# Patient Record
Sex: Male | Born: 1970 | Race: White | Hispanic: No | Marital: Single | Smoking: Former smoker
Health system: Southern US, Community
[De-identification: ages and names within clinical notes are randomized; demographics above are authoritative.]

## PROBLEM LIST (undated history)

## (undated) DIAGNOSIS — Q100 Congenital ptosis: Secondary | ICD-10-CM

## (undated) DIAGNOSIS — N200 Calculus of kidney: Secondary | ICD-10-CM

## (undated) DIAGNOSIS — M21752 Unequal limb length (acquired), left femur: Secondary | ICD-10-CM

## (undated) DIAGNOSIS — B1920 Unspecified viral hepatitis C without hepatic coma: Secondary | ICD-10-CM

## (undated) DIAGNOSIS — H524 Presbyopia: Secondary | ICD-10-CM

## (undated) DIAGNOSIS — H53031 Strabismic amblyopia, right eye: Secondary | ICD-10-CM

## (undated) DIAGNOSIS — M129 Arthropathy, unspecified: Secondary | ICD-10-CM

## (undated) DIAGNOSIS — K7400 Hepatic fibrosis, unspecified: Secondary | ICD-10-CM

## (undated) HISTORY — PX: EYE SURGERY: SHX253

## (undated) HISTORY — PX: ESOPHAGOGASTRODUODENOSCOPY: SHX1529

## (undated) HISTORY — PX: LEG SURGERY: SHX1003

## (undated) HISTORY — PX: OTHER SURGICAL HISTORY: SHX169

---

## 2018-12-28 ENCOUNTER — Emergency Department (HOSPITAL_COMMUNITY)
Admission: EM | Admit: 2018-12-28 | Discharge: 2018-12-29 | Attending: Emergency Medicine | Admitting: Emergency Medicine

## 2018-12-28 ENCOUNTER — Encounter (HOSPITAL_COMMUNITY): Payer: Self-pay

## 2018-12-28 ENCOUNTER — Other Ambulatory Visit: Payer: Self-pay

## 2018-12-28 ENCOUNTER — Emergency Department (HOSPITAL_COMMUNITY)

## 2018-12-28 DIAGNOSIS — N201 Calculus of ureter: Secondary | ICD-10-CM | POA: Diagnosis not present

## 2018-12-28 DIAGNOSIS — Z87891 Personal history of nicotine dependence: Secondary | ICD-10-CM | POA: Diagnosis not present

## 2018-12-28 DIAGNOSIS — R1030 Lower abdominal pain, unspecified: Secondary | ICD-10-CM | POA: Diagnosis present

## 2018-12-28 DIAGNOSIS — Z79899 Other long term (current) drug therapy: Secondary | ICD-10-CM | POA: Diagnosis not present

## 2018-12-28 HISTORY — DX: Strabismic amblyopia, right eye: H53.031

## 2018-12-28 HISTORY — DX: Unspecified viral hepatitis C without hepatic coma: B19.20

## 2018-12-28 HISTORY — DX: Congenital ptosis: Q10.0

## 2018-12-28 HISTORY — DX: Presbyopia: H52.4

## 2018-12-28 HISTORY — DX: Hepatic fibrosis, unspecified: K74.00

## 2018-12-28 HISTORY — DX: Arthropathy, unspecified: M12.9

## 2018-12-28 HISTORY — DX: Unequal limb length (acquired), left femur: M21.752

## 2018-12-28 LAB — URINALYSIS, ROUTINE W REFLEX MICROSCOPIC
Bilirubin Urine: NEGATIVE
Glucose, UA: NEGATIVE mg/dL
Ketones, ur: NEGATIVE mg/dL
Leukocytes,Ua: NEGATIVE
Nitrite: NEGATIVE
Protein, ur: 30 mg/dL — AB
RBC / HPF: >50 RBC/hpf — ABNORMAL HIGH (ref 0–5)
Specific Gravity, Urine: 1.013 (ref 1.005–1.030)
pH: 5 (ref 5.0–8.0)

## 2018-12-28 MED ORDER — ONDANSETRON 4 MG PO TBDP: 4.0000 mg | ORAL_TABLET | Freq: Three times a day (TID) | ORAL | 0 refills | Status: DC | PRN

## 2018-12-28 MED ORDER — TAMSULOSIN HCL 0.4 MG PO CAPS: 0.4000 mg | ORAL_CAPSULE | Freq: Every day | ORAL | 0 refills | Status: DC

## 2018-12-28 MED ORDER — HYDROCODONE-ACETAMINOPHEN 5-325 MG PO TABS: 2.0000 | ORAL_TABLET | ORAL | 0 refills | Status: DC | PRN

## 2018-12-28 NOTE — Discharge Instructions (Addendum)
Follow up with Urology for evaluation  °

## 2018-12-28 NOTE — ED Triage Notes (Signed)
Pt currently inmate at [facility]. Pt states he had a kidney stone last week. Pt states he started having pain in groin area this evening as well as difficulty urinating. Pt states he is still having difficulty urinating. Pt denies fever.

## 2018-12-28 NOTE — ED Provider Notes (Addendum)
[facility] EMERGENCY DEPARTMENT Provider Note   CSN: [id number] Arrival date & time: 12/28/18  2056     History   Chief Complaint Chief Complaint  Patient presents with  . Groin Pain    HPI Darius Jones is a 48 y.o. male.     The history is provided by the patient. No language interpreter was used.  Groin Pain This is a new problem. The current episode started 2 days ago. The problem occurs constantly. The problem has been gradually worsening. Pertinent negatives include no abdominal pain. Nothing aggravates the symptoms. He has tried nothing for the symptoms. The treatment provided no relief.   Pt reports he thinks he is passing a kidney stone.  Pt reports he had pain in his back that radiated to his groin.  Pt reports he had severe pain 2 days ago that resolved.  Pain returned today.   Past Medical History:  Diagnosis Date  . Arthropathy   . Congenital ptosis   . Hepatic fibrosis   . Hepatitis C   . Presbyopia   . Strabismic amblyopia of right eye   . Unequal limb length (acquired), left femur     There are no active problems to display for this patient.   Past Surgical History:  Procedure Laterality Date  . ESOPHAGOGASTRODUODENOSCOPY    . EYE SURGERY    . LEG SURGERY          Home Medications    Prior to Admission medications   Medication Sig Start Date End Date Taking? Authorizing Provider  acetaminophen (TYLENOL) 650 MG CR tablet Take 650 mg by mouth every 8 (eight) hours as needed for pain.   Yes [provider]  amoxicillin-clavulanate (AUGMENTIN) 875-125 MG tablet Take 1 tablet by mouth 2 (two) times daily. 10 day course starting on 12/19/2018   Yes [provider]  ibuprofen (ADVIL) 800 MG tablet Take 800 mg by mouth every 8 (eight) hours as needed for mild pain or moderate pain.    Yes [provider]    Family History No family history on file.  Social History Social History   Tobacco Use  . Smoking status: Former  Games developer  . Smokeless tobacco: Never Used  Substance Use Topics  . Alcohol use: Not Currently  . Drug use: Not Currently     Allergies   Patient has no known allergies.   Review of Systems Review of Systems  Gastrointestinal: Negative for abdominal pain.  All other systems reviewed and are negative.    Physical Exam Updated Vital Signs BP 139/81   Pulse 65   Temp 98.6 F (37 C) (Oral)   Resp 16   Ht 5\' 10"  (1.778 m)   Wt 115.7 kg   SpO2 96%   BMI 36.59 kg/m   Physical Exam Vitals signs and nursing note reviewed.  Constitutional:      Appearance: He is well-developed.  HENT:     Head: Normocephalic.     Nose: Nose normal.     Mouth/Throat:     Mouth: Mucous membranes are moist.  Neck:     Musculoskeletal: Normal range of motion.  Cardiovascular:     Rate and Rhythm: Normal rate.  Pulmonary:     Effort: Pulmonary effort is normal.  Abdominal:     General: There is no distension.  Musculoskeletal: Normal range of motion.  Skin:    General: Skin is warm.  Neurological:     Mental Status: He is alert and oriented  to person, place, and time.  Psychiatric:        Mood and Affect: Mood normal.      ED Treatments / Results  Labs (all labs ordered are listed, but only abnormal results are displayed) Labs Reviewed  URINALYSIS, ROUTINE W REFLEX MICROSCOPIC - Abnormal; Notable for the following components:      Result Value   APPearance HAZY (*)    Hgb urine dipstick LARGE (*)    Protein, ur 30 (*)    RBC / HPF >50 (*)    Bacteria, UA RARE (*)    All other components within normal limits    EKG None  Radiology Ct Renal Stone Study  Result Date: 12/28/2018 CLINICAL DATA:  Initial evaluation for acute left flank pain. EXAM: CT ABDOMEN AND PELVIS WITHOUT CONTRAST TECHNIQUE: Multidetector CT imaging of the abdomen and pelvis was performed following the standard protocol without IV contrast. COMPARISON:  Prior CT from 07/16/2009. FINDINGS: Lower chest: Mild  subsegmental atelectatic changes noted dependently within the visualized lung bases. Visualized lungs are otherwise clear. Hepatobiliary: Liver demonstrates a normal unenhanced appearance. Gallbladder within normal limits. No biliary dilatation. Pancreas: Pancreas within normal limits. Spleen: Spleen within normal limits. Adrenals/Urinary Tract: Adrenal glands are normal. There is a 3 mm obstructive stone within the distal left ureter with secondary mild left hydroureteronephrosis. No other radiopaque calculi seen along the course of the left ureter. Additional punctate nonobstructive stone noted within the interpolar left kidney. Right kidney within normal limits without nephrolithiasis or obstructive uropathy. Subcentimeter hypodensity at the upper pole the right kidney not well assessed on this noncontrast examination, but could reflect a small cyst. No obstructive radiopaque calculi seen along the course of the right renal collecting system. No right-sided hydroureter. Partially distended bladder within normal limits. No layering stones within the bladder lumen. Stomach/Bowel: Small hiatal hernia noted. Stomach otherwise unremarkable. No evidence for bowel obstruction. Normal appendix. No acute inflammatory changes seen about the bowels. Vascular/Lymphatic: Mild aorto bi-iliac atherosclerotic disease. No aneurysm. No adenopathy. Reproductive: Prostate within normal limits. Other: No free air or fluid. Small fat containing paraumbilical hernia noted without associated inflammation. Musculoskeletal: No acute osseous abnormality. No discrete lytic or blastic osseous lesions. Osteoarthritic changes about the hips bilaterally. Bone harvest site noted at the posterior left iliac wing. IMPRESSION: 1. 3 mm obstructive stone within the distal left ureter with secondary mild left hydroureteronephrosis. 2. Additional punctate nonobstructive left renal calculus. 3. No other acute intra-abdominal or pelvic process.  Electronically Signed   By: Rise MuBenjamin  McClintock M.D.   On: 12/28/2018 23:34    Procedures Procedures (including critical care time)  Medications Ordered in ED Medications - No data to display   Initial Impression / Assessment and Plan / ED Course  I have reviewed the triage vital signs and the nursing notes.  Pertinent labs & imaging results that were available during my care of the patient were reviewed by me and considered in my medical decision making (see chart for details).        MDM  Pt counseled on results.  Pt advised to follow up with urology.   Meds ordered this encounter  Medications  . tamsulosin (FLOMAX) 0.4 MG CAPS capsule    Sig: Take 1 capsule (0.4 mg total) by mouth daily.    Dispense:  14 capsule    Refill:  0    Order Specific Question:   Supervising Provider    Answer:   MILLER, BRIAN [[id number]]  . HYDROcodone-acetaminophen (NORCO/VICODIN)  5-325 MG tablet    Sig: Take 2 tablets by mouth every 4 (four) hours as needed.    Dispense:  10 tablet    Refill:  0    Order Specific Question:   Supervising Provider    Answer:   Sabra Heck, BRIAN [[id number]]  . ondansetron (ZOFRAN ODT) 4 MG disintegrating tablet    Sig: Take 1 tablet (4 mg total) by mouth every 8 (eight) hours as needed for nausea or vomiting.    Dispense:  20 tablet    Refill:  0    Order Specific Question:   Supervising Provider    Answer:   Noemi Chapel [[id number]]     Final Clinical Impressions(s) / ED Diagnoses   Final diagnoses:  Left ureteral stone    ED Discharge Orders         Ordered    tamsulosin (FLOMAX) 0.4 MG CAPS capsule  Daily     12/28/18 2345    HYDROcodone-acetaminophen (NORCO/VICODIN) 5-325 MG tablet  Every 4 hours PRN     12/28/18 2345    ondansetron (ZOFRAN ODT) 4 MG disintegrating tablet  Every 8 hours PRN     12/28/18 2345        An After Visit Summary was printed and given to the patient.    Fransico Meadow, PA-C 12/28/18 2349    Fransico Meadow, PA-C 12/29/18  1710    Fransico Meadow, [place] 12/29/18 1711    Sidney Ace 12/29/18 1711    Davonna Belling, MD 12/31/18 [phone]

## 2018-12-28 NOTE — ED Notes (Signed)
Patient transported to CT 

## 2018-12-29 MED ORDER — TAMSULOSIN HCL 0.4 MG PO CAPS: 0.4000 mg | ORAL_CAPSULE | Freq: Every day | ORAL | 0 refills | Status: DC

## 2019-07-07 ENCOUNTER — Other Ambulatory Visit: Payer: Self-pay

## 2019-07-07 ENCOUNTER — Encounter (HOSPITAL_COMMUNITY): Payer: Self-pay | Admitting: Emergency Medicine

## 2019-07-07 ENCOUNTER — Emergency Department (HOSPITAL_COMMUNITY)

## 2019-07-07 ENCOUNTER — Inpatient Hospital Stay (HOSPITAL_COMMUNITY)
Admission: EM | Admit: 2019-07-07 | Discharge: 2019-07-09 | DRG: 343 | Attending: General Surgery | Admitting: General Surgery

## 2019-07-07 DIAGNOSIS — B182 Chronic viral hepatitis C: Secondary | ICD-10-CM | POA: Diagnosis present

## 2019-07-07 DIAGNOSIS — Z79899 Other long term (current) drug therapy: Secondary | ICD-10-CM

## 2019-07-07 DIAGNOSIS — K353 Acute appendicitis with localized peritonitis, without perforation or gangrene: Principal | ICD-10-CM | POA: Diagnosis present

## 2019-07-07 DIAGNOSIS — Q100 Congenital ptosis: Secondary | ICD-10-CM

## 2019-07-07 DIAGNOSIS — N400 Enlarged prostate without lower urinary tract symptoms: Secondary | ICD-10-CM | POA: Diagnosis present

## 2019-07-07 DIAGNOSIS — Z87891 Personal history of nicotine dependence: Secondary | ICD-10-CM

## 2019-07-07 DIAGNOSIS — Z888 Allergy status to other drugs, medicaments and biological substances status: Secondary | ICD-10-CM

## 2019-07-07 DIAGNOSIS — Z9049 Acquired absence of other specified parts of digestive tract: Secondary | ICD-10-CM

## 2019-07-07 DIAGNOSIS — Z87442 Personal history of urinary calculi: Secondary | ICD-10-CM

## 2019-07-07 DIAGNOSIS — K7400 Hepatic fibrosis, unspecified: Secondary | ICD-10-CM | POA: Diagnosis present

## 2019-07-07 DIAGNOSIS — Z791 Long term (current) use of non-steroidal anti-inflammatories (NSAID): Secondary | ICD-10-CM

## 2019-07-07 DIAGNOSIS — Z20822 Contact with and (suspected) exposure to covid-19: Secondary | ICD-10-CM | POA: Diagnosis present

## 2019-07-07 DIAGNOSIS — J382 Nodules of vocal cords: Secondary | ICD-10-CM | POA: Diagnosis present

## 2019-07-07 HISTORY — DX: Calculus of kidney: N20.0

## 2019-07-07 LAB — COMPREHENSIVE METABOLIC PANEL
ALT: 29 U/L (ref 0–44)
AST: 23 U/L (ref 15–41)
Albumin: 4.3 g/dL (ref 3.5–5.0)
Alkaline Phosphatase: 53 U/L (ref 38–126)
Anion gap: 8 (ref 5–15)
BUN: 14 mg/dL (ref 6–20)
CO2: 26 mmol/L (ref 22–32)
Calcium: 9.1 mg/dL (ref 8.9–10.3)
Chloride: 102 mmol/L (ref 98–111)
Creatinine, Ser: 0.89 mg/dL (ref 0.61–1.24)
GFR calc Af Amer: >60 mL/min (ref >60)
GFR calc non Af Amer: >60 mL/min (ref >60)
Glucose, Bld: 115 mg/dL — ABNORMAL HIGH (ref 70–99)
Potassium: 3.7 mmol/L (ref 3.5–5.1)
Sodium: 136 mmol/L (ref 135–145)
Total Bilirubin: 1 mg/dL (ref 0.3–1.2)
Total Protein: 7.9 g/dL (ref 6.5–8.1)

## 2019-07-07 LAB — CBC
HCT: 38.5 % — ABNORMAL LOW (ref 39.0–52.0)
Hemoglobin: 13.1 g/dL (ref 13.0–17.0)
MCH: 31.4 pg (ref 26.0–34.0)
MCHC: 34 g/dL (ref 30.0–36.0)
MCV: 92.3 fL (ref 80.0–100.0)
Platelets: 164 K/uL (ref 150–400)
RBC: 4.17 MIL/uL — ABNORMAL LOW (ref 4.22–5.81)
RDW: 12.9 % (ref 11.5–15.5)
WBC: 11 K/uL — ABNORMAL HIGH (ref 4.0–10.5)
nRBC: 0 % (ref 0.0–0.2)

## 2019-07-07 MED ORDER — IOHEXOL 300 MG/ML  SOLN
100.0000 mL | Freq: Once | INTRAMUSCULAR | Status: AC | PRN
  Administered 2019-07-08: 100 mL via INTRAVENOUS

## 2019-07-07 MED ORDER — SODIUM CHLORIDE 0.9 % IV BOLUS
1000.0000 mL | Freq: Once | INTRAVENOUS | Status: AC
  Administered 2019-07-07: 1000 mL via INTRAVENOUS

## 2019-07-07 NOTE — ED Triage Notes (Signed)
Patient c/o RLQ abdominal pain that began at 1700 yesterday. No urinary symptoms.

## 2019-07-07 NOTE — ED Provider Notes (Signed)
[facility] EMERGENCY DEPARTMENT Provider Note   CSN: [id number] Arrival date & time: 07/07/19  2031     History Chief Complaint  Patient presents with  . Abdominal Pain    Darius Jones is a 49 y.o. male.  Patient presents to the emergency department for evaluation of abdominal pain.  Patient reports that pain began yesterday.  Pain has progressively worsened.  Pain is predominantly in the right lower and right lateral abdomen.  Patient denies urinary symptoms.  He has not had any fever.        Past Medical History:  Diagnosis Date  . Arthropathy   . Congenital ptosis   . Hepatic fibrosis   . Hepatitis C   . Kidney stones   . Presbyopia   . Strabismic amblyopia of right eye   . Unequal limb length (acquired), left femur     There are no problems to display for this patient.   Past Surgical History:  Procedure Laterality Date  . carpel tunnel     billateral  . ESOPHAGOGASTRODUODENOSCOPY    . EYE SURGERY    . LEG SURGERY         No family history on file.  Social History   Tobacco Use  . Smoking status: Former Games developer  . Smokeless tobacco: Never Used  Substance Use Topics  . Alcohol use: Not Currently  . Drug use: Not Currently    Home Medications Prior to Admission medications   Medication Sig Start Date End Date Taking? Authorizing Provider  acetaminophen (TYLENOL) 650 MG CR tablet Take 650 mg by mouth every 8 (eight) hours as needed for pain.    [provider]  amoxicillin-clavulanate (AUGMENTIN) 875-125 MG tablet Take 1 tablet by mouth 2 (two) times daily. 10 day course starting on 12/19/2018    [provider]  HYDROcodone-acetaminophen (NORCO/VICODIN) 5-325 MG tablet Take 2 tablets by mouth every 4 (four) hours as needed. 12/28/18   Elson Areas, PA-C  ibuprofen (ADVIL) 800 MG tablet Take 800 mg by mouth every 8 (eight) hours as needed for mild pain or moderate pain.     [provider]  ondansetron (ZOFRAN ODT) 4 MG  disintegrating tablet Take 1 tablet (4 mg total) by mouth every 8 (eight) hours as needed for nausea or vomiting. 12/28/18   Elson Areas, PA-C  tamsulosin (FLOMAX) 0.4 MG CAPS capsule Take 1 capsule (0.4 mg total) by mouth daily. 12/29/18   Elson Areas, PA-C    Allergies    Patient has no known allergies.  Review of Systems   Review of Systems  Gastrointestinal: Positive for abdominal pain.  All other systems reviewed and are negative.   Physical Exam Updated Vital Signs BP 135/83 (BP Location: Right Arm)   Pulse (!) 105   Temp 99.1 F (37.3 C) (Oral)   Resp 16   Ht 5\' 10"  (1.778 m)   Wt 115.7 kg   SpO2 99%   BMI 36.59 kg/m   Physical Exam Vitals and nursing note reviewed.  Constitutional:      General: He is not in acute distress.    Appearance: Normal appearance. He is well-developed.  HENT:     Head: Normocephalic and atraumatic.     Right Ear: Hearing normal.     Left Ear: Hearing normal.     Nose: Nose normal.  Eyes:     Conjunctiva/sclera: Conjunctivae normal.     Pupils: Pupils are equal, round, and reactive to light.  Cardiovascular:  Rate and Rhythm: Regular rhythm.     Heart sounds: S1 normal and S2 normal. No murmur. No friction rub. No gallop.   Pulmonary:     Effort: Pulmonary effort is normal. No respiratory distress.     Breath sounds: Normal breath sounds.  Chest:     Chest wall: No tenderness.  Abdominal:     General: Bowel sounds are normal.     Palpations: Abdomen is soft.     Tenderness: There is abdominal tenderness in the right lower quadrant. There is guarding and rebound. Positive signs include McBurney's sign. Negative signs include Murphy's sign.     Hernia: No hernia is present.  Musculoskeletal:        General: Normal range of motion.     Cervical back: Normal range of motion and neck supple.  Skin:    General: Skin is warm and dry.     Findings: No rash.  Neurological:     Mental Status: He is alert and oriented to person,  place, and time.     GCS: GCS eye subscore is 4. GCS verbal subscore is 5. GCS motor subscore is 6.     Cranial Nerves: No cranial nerve deficit.     Sensory: No sensory deficit.     Coordination: Coordination normal.  Psychiatric:        Speech: Speech normal.        Behavior: Behavior normal.        Thought Content: Thought content normal.     ED Results / Procedures / Treatments   Labs (all labs ordered are listed, but only abnormal results are displayed) Labs Reviewed  CBC - Abnormal; Notable for the following components:      Result Value   WBC 11.0 (*)    RBC 4.17 (*)    HCT 38.5 (*)    All other components within normal limits  COMPREHENSIVE METABOLIC PANEL - Abnormal; Notable for the following components:   Glucose, Bld 115 (*)    All other components within normal limits  URINALYSIS, ROUTINE W REFLEX MICROSCOPIC    EKG None  Radiology CT ABDOMEN PELVIS W CONTRAST  Result Date: 07/08/2019 CLINICAL DATA:  Right lower quadrant pain EXAM: CT ABDOMEN AND PELVIS WITH CONTRAST TECHNIQUE: Multidetector CT imaging of the abdomen and pelvis was performed using the standard protocol following bolus administration of intravenous contrast. CONTRAST:  162mL OMNIPAQUE IOHEXOL 300 MG/ML  SOLN COMPARISON:  CT 05/21/2019 FINDINGS: Lower chest: Bibasilar areas of atelectasis. No consolidation or effusion. Normal heart size. No pericardial effusion. Few coronary artery calcifications are present. Hepatobiliary: Diffuse hepatic hypoattenuation compatible with hepatic steatosis. No focal liver abnormality is seen. No gallstones, gallbladder wall thickening, or biliary dilatation. Pancreas: Unremarkable. No pancreatic ductal dilatation or surrounding inflammatory changes. Spleen: Normal in size without focal abnormality. Adrenals/Urinary Tract: Normal adrenal glands. Stable mild bilateral symmetric perinephric stranding, a nonspecific finding though may correlate with either age or decreased  renal function. Few subcentimeter hypoattenuating foci in the kidneys too small to fully characterize on CT imaging but statistically likely benign. Kidneys are otherwise unremarkable, without renal calculi, suspicious lesion, or hydronephrosis. Bladder is unremarkable. Stomach/Bowel: Distal esophagus, stomach and duodenal sweep are unremarkable. No proximal small bowel wall thickening or dilatation. There is an edematous, fluid-filled and hyperemic appendix in the right lower quadrant measuring up to 12 mm in diameter (2/65). There is extensive surrounding phlegmonous change and some reactive thickening of the adjacent ileum and cecum. No extraluminal gas, organized collection  or abscess is seen. Remaining portions of the colon are unremarkable. Vascular/Lymphatic: Atherosclerotic plaque within the normal caliber aorta. Reactive adenopathy in the right lower quadrant. Otherwise, no concerning abdominopelvic lymph nodes. Reproductive: The prostate and seminal vesicles are unremarkable. Other: Stranding and phlegmonous change and small amount of likely reactive free fluid in the right lower quadrant layering within the deep pelvis. Musculoskeletal: Minimal degenerative changes in the spine. Moderate degenerative features noted in the bilateral hips with periacetabular spurring. No acute or worrisome osseous lesions. IMPRESSION: 1. Acute appendicitis with extensive surrounding phlegmonous change and small amount of likely reactive free fluid in the right lower quadrant. Slight reactive thickening of the adjacent cecum and terminal ileum. No convincing features of perforation such as extraluminal gas, organized collection or abscess is seen. 2. Hepatic steatosis. 3. Coronary artery calcifications. 4. Aortic Atherosclerosis (ICD10-I70.0). Electronically Signed   By: Kreg Shropshire M.D.   On: 07/08/2019 01:00    Procedures Procedures (including critical care time)  Medications Ordered in ED Medications  sodium  chloride 0.9 % bolus 1,000 mL (1,000 mLs Intravenous New Bag/Given 07/07/19 2346)  iohexol (OMNIPAQUE) 300 MG/ML solution 100 mL (100 mLs Intravenous Contrast Given 07/08/19 0017)    ED Course  I have reviewed the triage vital signs and the nursing notes.  Pertinent labs & imaging results that were available during my care of the patient were reviewed by me and considered in my medical decision making (see chart for details).    MDM Rules/Calculators/A&P                      Patient presents with progressively worsening right lower quadrant pain that began yesterday.  Examination revealed guarding and rebound in the right lower quadrant.  CT scan shows acute appendicitis.  Final Clinical Impression(s) / ED Diagnoses Final diagnoses:  Acute appendicitis with localized peritonitis, without perforation, abscess, or gangrene    Rx / DC Orders ED Discharge Orders    None       Raquell Richer, Canary Brim, MD 07/08/19 0121

## 2019-07-08 ENCOUNTER — Encounter (HOSPITAL_COMMUNITY): Payer: Self-pay | Admitting: Internal Medicine

## 2019-07-08 ENCOUNTER — Emergency Department (HOSPITAL_COMMUNITY)

## 2019-07-08 ENCOUNTER — Other Ambulatory Visit: Payer: Self-pay

## 2019-07-08 ENCOUNTER — Encounter (HOSPITAL_COMMUNITY): Admission: EM | Payer: Self-pay | Attending: General Surgery

## 2019-07-08 ENCOUNTER — Inpatient Hospital Stay (HOSPITAL_COMMUNITY): Admitting: Anesthesiology

## 2019-07-08 DIAGNOSIS — Z20822 Contact with and (suspected) exposure to covid-19: Secondary | ICD-10-CM | POA: Diagnosis present

## 2019-07-08 DIAGNOSIS — Z87891 Personal history of nicotine dependence: Secondary | ICD-10-CM | POA: Diagnosis not present

## 2019-07-08 DIAGNOSIS — Z791 Long term (current) use of non-steroidal anti-inflammatories (NSAID): Secondary | ICD-10-CM | POA: Diagnosis not present

## 2019-07-08 DIAGNOSIS — N400 Enlarged prostate without lower urinary tract symptoms: Secondary | ICD-10-CM | POA: Diagnosis present

## 2019-07-08 DIAGNOSIS — J382 Nodules of vocal cords: Secondary | ICD-10-CM | POA: Diagnosis present

## 2019-07-08 DIAGNOSIS — Z888 Allergy status to other drugs, medicaments and biological substances status: Secondary | ICD-10-CM | POA: Diagnosis not present

## 2019-07-08 DIAGNOSIS — B182 Chronic viral hepatitis C: Secondary | ICD-10-CM | POA: Diagnosis present

## 2019-07-08 DIAGNOSIS — K353 Acute appendicitis with localized peritonitis, without perforation or gangrene: Secondary | ICD-10-CM | POA: Diagnosis present

## 2019-07-08 DIAGNOSIS — Z87442 Personal history of urinary calculi: Secondary | ICD-10-CM | POA: Diagnosis not present

## 2019-07-08 DIAGNOSIS — Q100 Congenital ptosis: Secondary | ICD-10-CM | POA: Diagnosis not present

## 2019-07-08 DIAGNOSIS — Z79899 Other long term (current) drug therapy: Secondary | ICD-10-CM | POA: Diagnosis not present

## 2019-07-08 DIAGNOSIS — K7400 Hepatic fibrosis, unspecified: Secondary | ICD-10-CM | POA: Diagnosis present

## 2019-07-08 HISTORY — PX: LAPAROSCOPIC APPENDECTOMY: SHX408

## 2019-07-08 LAB — CBC
HCT: 34.7 % — ABNORMAL LOW (ref 39.0–52.0)
Hemoglobin: 11.7 g/dL — ABNORMAL LOW (ref 13.0–17.0)
MCH: 31.2 pg (ref 26.0–34.0)
MCHC: 33.7 g/dL (ref 30.0–36.0)
MCV: 92.5 fL (ref 80.0–100.0)
Platelets: 147 10*3/uL — ABNORMAL LOW (ref 150–400)
RBC: 3.75 MIL/uL — ABNORMAL LOW (ref 4.22–5.81)
RDW: 12.9 % (ref 11.5–15.5)
WBC: 9.2 10*3/uL (ref 4.0–10.5)
nRBC: 0 % (ref 0.0–0.2)

## 2019-07-08 LAB — PROTIME-INR
INR: 1.1 (ref 0.8–1.2)
Prothrombin Time: 13.8 seconds (ref 11.4–15.2)

## 2019-07-08 LAB — COMPREHENSIVE METABOLIC PANEL
ALT: 28 U/L (ref 0–44)
AST: 23 U/L (ref 15–41)
Albumin: 3.8 g/dL (ref 3.5–5.0)
Alkaline Phosphatase: 48 U/L (ref 38–126)
Anion gap: 9 (ref 5–15)
BUN: 12 mg/dL (ref 6–20)
CO2: 25 mmol/L (ref 22–32)
Calcium: 8.7 mg/dL — ABNORMAL LOW (ref 8.9–10.3)
Chloride: 103 mmol/L (ref 98–111)
Creatinine, Ser: 0.89 mg/dL (ref 0.61–1.24)
GFR calc Af Amer: >60 mL/min (ref >60)
GFR calc non Af Amer: >60 mL/min (ref >60)
Glucose, Bld: 119 mg/dL — ABNORMAL HIGH (ref 70–99)
Potassium: 3.7 mmol/L (ref 3.5–5.1)
Sodium: 137 mmol/L (ref 135–145)
Total Bilirubin: 1.3 mg/dL — ABNORMAL HIGH (ref 0.3–1.2)
Total Protein: 7 g/dL (ref 6.5–8.1)

## 2019-07-08 LAB — URINALYSIS, ROUTINE W REFLEX MICROSCOPIC
Bilirubin Urine: NEGATIVE
Glucose, UA: NEGATIVE mg/dL
Hgb urine dipstick: NEGATIVE
Ketones, ur: NEGATIVE mg/dL
Leukocytes,Ua: NEGATIVE
Nitrite: NEGATIVE
Protein, ur: NEGATIVE mg/dL
Specific Gravity, Urine: >1.046 — ABNORMAL HIGH (ref 1.005–1.030)
pH: 7 (ref 5.0–8.0)

## 2019-07-08 LAB — RESPIRATORY PANEL BY RT PCR (FLU A&B, COVID)
Influenza A by PCR: NEGATIVE
Influenza B by PCR: NEGATIVE
SARS Coronavirus 2 by RT PCR: NEGATIVE

## 2019-07-08 LAB — HIV ANTIBODY (ROUTINE TESTING W REFLEX): HIV Screen 4th Generation wRfx: NONREACTIVE

## 2019-07-08 LAB — MRSA PCR SCREENING: MRSA by PCR: NEGATIVE

## 2019-07-08 SURGERY — APPENDECTOMY, LAPAROSCOPIC
Anesthesia: General | Site: Abdomen

## 2019-07-08 MED ORDER — DEXAMETHASONE SODIUM PHOSPHATE 10 MG/ML IJ SOLN
INTRAMUSCULAR | Status: DC | PRN
  Administered 2019-07-08: 8 mg via INTRAVENOUS

## 2019-07-08 MED ORDER — PHENYLEPHRINE 40 MCG/ML (10ML) SYRINGE FOR IV PUSH (FOR BLOOD PRESSURE SUPPORT)
PREFILLED_SYRINGE | INTRAVENOUS | Status: AC
  Filled 2019-07-08: qty 10

## 2019-07-08 MED ORDER — LIDOCAINE HCL (CARDIAC) PF 100 MG/5ML IV SOSY
PREFILLED_SYRINGE | INTRAVENOUS | Status: DC | PRN
  Administered 2019-07-08: 100 mg via INTRAVENOUS

## 2019-07-08 MED ORDER — HYDROMORPHONE HCL 1 MG/ML IJ SOLN: 1.0000 mg | Freq: Once | INTRAMUSCULAR | Status: DC

## 2019-07-08 MED ORDER — PIPERACILLIN-TAZOBACTAM 3.375 G IVPB 30 MIN
3.3750 g | Freq: Four times a day (QID) | INTRAVENOUS | Status: DC
  Administered 2019-07-08: 3.375 g via INTRAVENOUS
  Filled 2019-07-08 (×3): qty 50

## 2019-07-08 MED ORDER — LIDOCAINE 2% (20 MG/ML) 5 ML SYRINGE
INTRAMUSCULAR | Status: AC
  Filled 2019-07-08: qty 15

## 2019-07-08 MED ORDER — BUPIVACAINE LIPOSOME 1.3 % IJ SUSP
INTRAMUSCULAR | Status: AC
  Filled 2019-07-08: qty 20

## 2019-07-08 MED ORDER — PIPERACILLIN-TAZOBACTAM 3.375 G IVPB
3.3750 g | Freq: Three times a day (TID) | INTRAVENOUS | Status: DC
  Filled 2019-07-08: qty 50

## 2019-07-08 MED ORDER — ROCURONIUM BROMIDE 10 MG/ML (PF) SYRINGE
PREFILLED_SYRINGE | INTRAVENOUS | Status: AC
  Filled 2019-07-08: qty 10

## 2019-07-08 MED ORDER — LACTATED RINGERS IV SOLN: Freq: Once | INTRAVENOUS | Status: AC

## 2019-07-08 MED ORDER — MORPHINE SULFATE (PF) 2 MG/ML IV SOLN
2.0000 mg | INTRAVENOUS | Status: DC | PRN
  Administered 2019-07-08 (×2): 2 mg via INTRAVENOUS
  Filled 2019-07-08 (×2): qty 1

## 2019-07-08 MED ORDER — MIDAZOLAM HCL 2 MG/2ML IJ SOLN
INTRAMUSCULAR | Status: DC | PRN
  Administered 2019-07-08: 1 mg via INTRAVENOUS

## 2019-07-08 MED ORDER — SODIUM CHLORIDE 0.9 % IV SOLN
2.0000 g | INTRAVENOUS | Status: AC
  Administered 2019-07-08: 2 g via INTRAVENOUS

## 2019-07-08 MED ORDER — SUCCINYLCHOLINE CHLORIDE 200 MG/10ML IV SOSY
PREFILLED_SYRINGE | INTRAVENOUS | Status: AC
  Filled 2019-07-08: qty 10

## 2019-07-08 MED ORDER — FENTANYL CITRATE (PF) 100 MCG/2ML IJ SOLN
50.0000 ug | INTRAMUSCULAR | Status: AC | PRN
  Administered 2019-07-08 (×2): 50 ug via INTRAVENOUS

## 2019-07-08 MED ORDER — MEPERIDINE HCL 50 MG/ML IJ SOLN: 6.2500 mg | INTRAMUSCULAR | Status: DC | PRN

## 2019-07-08 MED ORDER — GLYCOPYRROLATE 0.2 MG/ML IJ SOLN
INTRAMUSCULAR | Status: DC | PRN
  Administered 2019-07-08 (×2): .1 mg via INTRAVENOUS

## 2019-07-08 MED ORDER — ROCURONIUM BROMIDE 10 MG/ML (PF) SYRINGE
PREFILLED_SYRINGE | INTRAVENOUS | Status: AC
  Filled 2019-07-08: qty 20

## 2019-07-08 MED ORDER — FENTANYL CITRATE (PF) 250 MCG/5ML IJ SOLN
INTRAMUSCULAR | Status: AC
  Filled 2019-07-08: qty 5

## 2019-07-08 MED ORDER — DOCUSATE SODIUM 100 MG PO CAPS
100.0000 mg | ORAL_CAPSULE | Freq: Two times a day (BID) | ORAL | Status: DC
  Administered 2019-07-08 (×2): 100 mg via ORAL
  Filled 2019-07-08 (×2): qty 1

## 2019-07-08 MED ORDER — FENTANYL CITRATE (PF) 100 MCG/2ML IJ SOLN
50.0000 ug | Freq: Once | INTRAMUSCULAR | Status: AC
  Administered 2019-07-08 (×5): 50 ug via INTRAVENOUS

## 2019-07-08 MED ORDER — ONDANSETRON HCL 4 MG/2ML IJ SOLN
4.0000 mg | Freq: Once | INTRAMUSCULAR | Status: AC
  Administered 2019-07-08: 4 mg via INTRAVENOUS
  Filled 2019-07-08: qty 2

## 2019-07-08 MED ORDER — FENTANYL CITRATE (PF) 100 MCG/2ML IJ SOLN
INTRAMUSCULAR | Status: AC
  Filled 2019-07-08: qty 2

## 2019-07-08 MED ORDER — PHENOL 1.4 % MT LIQD
1.0000 | OROMUCOSAL | Status: DC | PRN
  Administered 2019-07-08: 1 via OROMUCOSAL
  Filled 2019-07-08: qty 177

## 2019-07-08 MED ORDER — PROPOFOL 10 MG/ML IV BOLUS
INTRAVENOUS | Status: DC | PRN
  Administered 2019-07-08: 20 mg via INTRAVENOUS
  Administered 2019-07-08: 200 mg via INTRAVENOUS
  Administered 2019-07-08: 40 mg via INTRAVENOUS

## 2019-07-08 MED ORDER — PROPOFOL 10 MG/ML IV BOLUS
INTRAVENOUS | Status: AC
  Filled 2019-07-08: qty 40

## 2019-07-08 MED ORDER — ACETAMINOPHEN 650 MG RE SUPP: 650.0000 mg | Freq: Four times a day (QID) | RECTAL | Status: DC | PRN

## 2019-07-08 MED ORDER — SUCCINYLCHOLINE CHLORIDE 200 MG/10ML IV SOSY
PREFILLED_SYRINGE | INTRAVENOUS | Status: DC | PRN
  Administered 2019-07-08: 160 mg via INTRAVENOUS

## 2019-07-08 MED ORDER — ONDANSETRON HCL 4 MG/2ML IJ SOLN
INTRAMUSCULAR | Status: AC
  Filled 2019-07-08: qty 2

## 2019-07-08 MED ORDER — SODIUM CHLORIDE 0.9 % IV SOLN: INTRAVENOUS | Status: DC

## 2019-07-08 MED ORDER — CHLORHEXIDINE GLUCONATE CLOTH 2 % EX PADS
6.0000 | MEDICATED_PAD | Freq: Once | CUTANEOUS | Status: AC
  Administered 2019-07-08: 6 via TOPICAL

## 2019-07-08 MED ORDER — DEXAMETHASONE SODIUM PHOSPHATE 10 MG/ML IJ SOLN
INTRAMUSCULAR | Status: AC
  Filled 2019-07-08: qty 1

## 2019-07-08 MED ORDER — PROMETHAZINE HCL 25 MG/ML IJ SOLN: 6.2500 mg | INTRAMUSCULAR | Status: DC | PRN

## 2019-07-08 MED ORDER — SODIUM CHLORIDE 0.9 % IV SOLN
INTRAVENOUS | Status: AC
  Filled 2019-07-08: qty 2

## 2019-07-08 MED ORDER — CHLORHEXIDINE GLUCONATE CLOTH 2 % EX PADS: 6.0000 | MEDICATED_PAD | Freq: Once | CUTANEOUS | Status: DC

## 2019-07-08 MED ORDER — SUGAMMADEX SODIUM 200 MG/2ML IV SOLN: INTRAVENOUS | Status: DC | PRN

## 2019-07-08 MED ORDER — OXYCODONE HCL 5 MG PO TABS
5.0000 mg | ORAL_TABLET | ORAL | Status: DC | PRN
  Administered 2019-07-08: 5 mg via ORAL
  Filled 2019-07-08: qty 1

## 2019-07-08 MED ORDER — ENOXAPARIN SODIUM 40 MG/0.4ML ~~LOC~~ SOLN
40.0000 mg | SUBCUTANEOUS | Status: DC
  Filled 2019-07-08: qty 0.4

## 2019-07-08 MED ORDER — ACETAMINOPHEN 325 MG PO TABS: 650.0000 mg | ORAL_TABLET | Freq: Four times a day (QID) | ORAL | Status: DC | PRN

## 2019-07-08 MED ORDER — SODIUM CHLORIDE 0.9 % IR SOLN
Status: DC | PRN
  Administered 2019-07-08: 1000 mL

## 2019-07-08 MED ORDER — SUGAMMADEX SODIUM 200 MG/2ML IV SOLN
INTRAVENOUS | Status: DC | PRN
  Administered 2019-07-08: 200 mg via INTRAVENOUS

## 2019-07-08 MED ORDER — ROCURONIUM BROMIDE 100 MG/10ML IV SOLN
INTRAVENOUS | Status: DC | PRN
  Administered 2019-07-08: 35 mg via INTRAVENOUS

## 2019-07-08 MED ORDER — GLYCOPYRROLATE PF 0.2 MG/ML IJ SOSY
PREFILLED_SYRINGE | INTRAMUSCULAR | Status: AC
  Filled 2019-07-08: qty 1

## 2019-07-08 MED ORDER — ONDANSETRON HCL 4 MG/2ML IJ SOLN
4.0000 mg | Freq: Four times a day (QID) | INTRAMUSCULAR | Status: DC | PRN
  Administered 2019-07-08: 12:00:00 4 mg via INTRAVENOUS

## 2019-07-08 MED ORDER — MORPHINE SULFATE (PF) 4 MG/ML IV SOLN
8.0000 mg | Freq: Once | INTRAVENOUS | Status: AC
  Administered 2019-07-08: 02:00:00 8 mg via INTRAVENOUS
  Filled 2019-07-08: qty 2

## 2019-07-08 MED ORDER — BUPIVACAINE LIPOSOME 1.3 % IJ SUSP
INTRAMUSCULAR | Status: DC | PRN
  Administered 2019-07-08: 20 mL

## 2019-07-08 MED ORDER — ONDANSETRON HCL 4 MG PO TABS: 4.0000 mg | ORAL_TABLET | Freq: Four times a day (QID) | ORAL | Status: DC | PRN

## 2019-07-08 MED ORDER — MIDAZOLAM HCL 2 MG/2ML IJ SOLN
INTRAMUSCULAR | Status: AC
  Filled 2019-07-08: qty 2

## 2019-07-08 MED ORDER — HYDROMORPHONE HCL 1 MG/ML IJ SOLN
0.2500 mg | INTRAMUSCULAR | Status: DC | PRN
  Administered 2019-07-08: 0.5 mg via INTRAVENOUS
  Filled 2019-07-08: qty 0.5

## 2019-07-08 SURGICAL SUPPLY — 48 items
BAG RETRIEVAL 10 (BASKET) ×1
BLADE SURG 15 STRL LF DISP TIS (BLADE) ×1 IMPLANT
BLADE SURG 15 STRL SS (BLADE) ×1
CHLORAPREP W/TINT 26 (MISCELLANEOUS) ×2 IMPLANT
CLOTH BEACON ORANGE TIMEOUT ST (SAFETY) ×2 IMPLANT
COVER LIGHT HANDLE STERIS (MISCELLANEOUS) ×4 IMPLANT
COVER WAND RF STERILE (DRAPES) ×2 IMPLANT
CUTTER FLEX LINEAR 45M (STAPLE) ×2 IMPLANT
DECANTER SPIKE VIAL GLASS SM (MISCELLANEOUS) ×2 IMPLANT
DERMABOND ADVANCED (GAUZE/BANDAGES/DRESSINGS) ×1
DERMABOND ADVANCED .7 DNX12 (GAUZE/BANDAGES/DRESSINGS) ×1 IMPLANT
ELECT REM PT RETURN 9FT ADLT (ELECTROSURGICAL) ×2
ELECTRODE REM PT RTRN 9FT ADLT (ELECTROSURGICAL) ×1 IMPLANT
GLOVE BIOGEL PI IND STRL 6.5 (GLOVE) ×2 IMPLANT
GLOVE BIOGEL PI IND STRL 7.0 (GLOVE) ×3 IMPLANT
GLOVE BIOGEL PI INDICATOR 6.5 (GLOVE) ×2
GLOVE BIOGEL PI INDICATOR 7.0 (GLOVE) ×3
GLOVE ECLIPSE 6.5 STRL STRAW (GLOVE) ×2 IMPLANT
GOWN STRL REUS W/ TWL LRG LVL3 (GOWN DISPOSABLE) ×1 IMPLANT
GOWN STRL REUS W/TWL LRG LVL3 (GOWN DISPOSABLE) ×5 IMPLANT
INST SET LAPROSCOPIC AP (KITS) ×2 IMPLANT
KIT TURNOVER KIT A (KITS) ×2 IMPLANT
MANIFOLD NEPTUNE II (INSTRUMENTS) ×2 IMPLANT
NEEDLE HYPO 18GX1.5 BLUNT FILL (NEEDLE) ×2 IMPLANT
NEEDLE HYPO 22GX1.5 SAFETY (NEEDLE) ×2 IMPLANT
NEEDLE INSUFFLATION 14GA 120MM (NEEDLE) ×2 IMPLANT
NS IRRIG 1000ML POUR BTL (IV SOLUTION) ×2 IMPLANT
PACK LAP CHOLE LZT030E (CUSTOM PROCEDURE TRAY) ×2 IMPLANT
PAD ARMBOARD 7.5X6 YLW CONV (MISCELLANEOUS) ×2 IMPLANT
PENCIL HANDSWITCHING (ELECTRODE) ×2 IMPLANT
RELOAD 45 VASCULAR/THIN (ENDOMECHANICALS) IMPLANT
RELOAD STAPLE TA45 3.5 REG BLU (ENDOMECHANICALS) ×2 IMPLANT
SET BASIN LINEN APH (SET/KITS/TRAYS/PACK) ×2 IMPLANT
SET TUBE IRRIG SUCTION NO TIP (IRRIGATION / IRRIGATOR) IMPLANT
SET TUBE SMOKE EVAC HIGH FLOW (TUBING) ×2 IMPLANT
SHEARS HARMONIC ACE PLUS 36CM (ENDOMECHANICALS) ×2 IMPLANT
SUT MNCRL AB 4-0 PS2 18 (SUTURE) ×4 IMPLANT
SUT VICRYL 0 UR6 27IN ABS (SUTURE) ×2 IMPLANT
SYR 20ML LL LF (SYRINGE) ×4 IMPLANT
SYS BAG RETRIEVAL 10MM (BASKET) ×1
SYSTEM BAG RETRIEVAL 10MM (BASKET) ×1 IMPLANT
TRAY FOLEY W/BAG SLVR 16FR (SET/KITS/TRAYS/PACK) ×1
TRAY FOLEY W/BAG SLVR 16FR ST (SET/KITS/TRAYS/PACK) ×1 IMPLANT
TROCAR ENDO BLADELESS 11MM (ENDOMECHANICALS) ×2 IMPLANT
TROCAR ENDO BLADELESS 12MM (ENDOMECHANICALS) ×2 IMPLANT
TROCAR XCEL NON-BLD 5MMX100MML (ENDOMECHANICALS) ×2 IMPLANT
WARMER LAPAROSCOPE (MISCELLANEOUS) ×2 IMPLANT
YANKAUER SUCT 12FT TUBE ARGYLE (SUCTIONS) ×2 IMPLANT

## 2019-07-08 NOTE — OR Nursing (Signed)
Pain level 8

## 2019-07-08 NOTE — Progress Notes (Signed)
Patient seen and evaluated, chart reviewed, please see EMR for updated orders. Please see full H&P dictated by admitting physician Dr Welton Flakes for same date of service.    Brief Summary  49 y.o. male with medical history significant of hepatic fibrosis, hepatitis C, presbyopia and congenital ptosis [facility]   A/p 1) acute appendicitis--surgical consult appreciated, for appendectomy later today, prn pain medications, anti-nausea  -WBC is down to 9.2 from 11.0  2) BPH--previously on Flomax  3)H/o Hep C--- LFTs WNL except for T bili of 1.3 -Outpatient follow-up advised   Patient seen and evaluated, chart reviewed, please see EMR for updated orders. Please see full H&P dictated by admitting physician Dr Welton Flakes for same date of service.

## 2019-07-08 NOTE — Addendum Note (Signed)
Addendum  created 07/08/19 1342 by Earmon Phoenix, CRNA   Charge Capture section accepted

## 2019-07-08 NOTE — Transfer of Care (Signed)
Immediate Anesthesia Transfer of Care Note  Patient: Darius Jones  Procedure(s) Performed: APPENDECTOMY LAPAROSCOPIC (N/A Abdomen)  Patient Location: PACU  Anesthesia Type:General  Level of Consciousness: awake, alert  and oriented  Airway & Oxygen Therapy: Patient Spontanous Breathing and Patient connected to face mask oxygen  Post-op Assessment: Report given to RN, Post -op Vital signs reviewed and stable and Patient moving all extremities X 4  Post vital signs: Reviewed and stable  Last Vitals:  Vitals Value Taken Time  BP 102/76 07/08/19 1301  Temp    Pulse 80 07/08/19 1311  Resp 18 07/08/19 1311  SpO2 98 % 07/08/19 1311  Vitals shown include unvalidated device data.  Last Pain:  Vitals:   07/08/19 1047  TempSrc: Oral  PainSc: 10-Worst pain ever      Patients Stated Pain Goal: 5 (07/08/19 1047)  Complications: No apparent anesthesia complications

## 2019-07-08 NOTE — Anesthesia Procedure Notes (Addendum)
Procedure Name: Intubation Date/Time: 07/08/2019 11:50 AM Performed by: Earmon Phoenix, CRNA Pre-anesthesia Checklist: Patient identified, Emergency Drugs available, Suction available, Patient being monitored and Timeout performed Patient Re-evaluated:Patient Re-evaluated prior to induction Oxygen Delivery Method: Circle system utilized Preoxygenation: Pre-oxygenation with 100% oxygen Induction Type: IV induction Laryngoscope Size: Glidescope and 3 Tube type: Oral Tube size: 7.5 mm Number of attempts: 1 Airway Equipment and Method: Stylet and Video-laryngoscopy Placement Confirmation: ETT inserted through vocal cords under direct vision,  positive ETCO2,  CO2 detector and breath sounds checked- equal and bilateral Secured at: 23 cm Tube secured with: Tape Dental Injury: Teeth and Oropharynx as per pre-operative assessment  Comments: Polyp type structure noted at base of cords

## 2019-07-08 NOTE — Anesthesia Postprocedure Evaluation (Signed)
Anesthesia Post Note  Patient: Darius Jones  Procedure(s) Performed: APPENDECTOMY LAPAROSCOPIC (N/A Abdomen)  Patient location during evaluation: PACU Anesthesia Type: General Level of consciousness: awake and alert and oriented Pain management: pain level controlled Vital Signs Assessment: post-procedure vital signs reviewed and stable Respiratory status: spontaneous breathing Cardiovascular status: blood pressure returned to baseline Postop Assessment: no apparent nausea or vomiting Anesthetic complications: no     Last Vitals:  Vitals:   07/08/19 1300 07/08/19 1316  BP:  117/66  Pulse:  88  Resp:  18  Temp:    SpO2: 97% 98%    Last Pain:  Vitals:   07/08/19 1321  TempSrc:   PainSc: 8                  Breshae Belcher C Jaan Fischel

## 2019-07-08 NOTE — Discharge Instructions (Signed)
Discharge Laparoscopic Surgery Instructions:  Common Complaints: Right shoulder pain is common after laparoscopic surgery. This is secondary to the gas used in the surgery being trapped under the diaphragm.  Walk to help your body absorb the gas. This will improve in a few days. Pain at the port sites are common, especially the larger port sites. This will improve with time.  Some nausea is common and poor appetite. The main goal is to stay hydrated the first few days after surgery.   Diet/ Activity: Diet as tolerated. You may not have an appetite, but it is important to stay hydrated. Drink 64 ounces of water a day. Your appetite will return with time.  Shower per your regular routine daily.  Do not take hot showers. Take warm showers that are less than 10 minutes. Rest and listen to your body, but do not remain in bed all day.  Walk everyday for at least 15-20 minutes. Deep cough and move around every 1-2 hours in the first few days after surgery.  Do not lift > 10 lbs, perform excessive bending, pushing, pulling, squatting for 1-2 weeks after surgery.  Do not pick at the dermabond glue on your incision sites.  This glue film will remain in place for 1-2 weeks and will start to peel off.  Do not place lotions or balms on your incision unless instructed to specifically by Dr. Jaana Brodt.   Pain Expectations and Narcotics: -After surgery you will have pain associated with your incisions and this is normal. The pain is muscular and nerve pain, and will get better with time. -You are encouraged and expected to take non narcotic medications like tylenol and ibuprofen (when able) to treat pain as multiple modalities can aid with pain treatment. -Narcotics are only used when pain is severe or there is breakthrough pain. -You are not expected to have a pain score of 0 after surgery, as we cannot prevent pain. A pain score of 3-4 that allows you to be functional, move, walk, and tolerate some activity is  the goal. The pain will continue to improve over the days after surgery and is dependent on your surgery. -Due to [place] law, we are only able to give a certain amount of pain medication to treat post operative pain, and we only give additional narcotics on a patient by patient basis.  -For most laparoscopic surgery, studies have shown that the majority of patients only need 10-15 narcotic pills, and for open surgeries most patients only need 15-20.   -Having appropriate expectations of pain and knowledge of pain management with non narcotics is important as we do not want anyone to become addicted to narcotic pain medication.  -Using ice packs in the first 48 hours and heating pads after 48 hours, wearing an abdominal binder (when recommended), and using over the counter medications are all ways to help with pain management.   -Simple acts like meditation and mindfulness practices after surgery can also help with pain control and research has proven the benefit of these practices.  Medication: Take tylenol and ibuprofen as needed for pain control, alternating every 4-6 hours.  Example:  Tylenol 1000mg @ 6am, 12noon, 6pm, 12midnight (Do not exceed 4000mg of tylenol a day). Ibuprofen 800mg @ 9am, 3pm, 9pm, 3am (Do not exceed 3600mg of ibuprofen a day).  Take Roxicodone for breakthrough pain every 4 hours.  Take Colace for constipation related to narcotic pain medication. If you do not have a bowel movement in 2 days, take Miralax   over the counter.  Drink plenty of water to also prevent constipation.   Contact Information: If you have questions or concerns, please call our office, [phone], Monday- Thursday 8AM-5PM and Friday 8AM-12Noon.  If it is after hours or on the weekend, please call [facility], [phone], and ask to speak to the surgeon on call for Dr. Henreitta Leber at [facility].     Laparoscopic Appendectomy, Adult, Care After This sheet gives you information about how to care for  yourself after your procedure. Your doctor may also give you more specific instructions. If you have problems or questions, contact your doctor. What can I expect after the procedure? After the procedure, it is common to have:  Little energy for normal activities.  Mild pain in the area where the cuts from surgery (incisions) were made.  Trouble pooping (constipation). This can be caused by: ? Pain medicine. ? A lack of activity. Follow these instructions at home: Medicines  Take over-the-counter and prescription medicines only as told by your doctor.  If you were prescribed an antibiotic medicine, take it as told by your doctor. Do not stop taking it even if you start to feel better.  Do not drive or use heavy machinery while taking prescription pain medicine.  Ask your doctor if the medicine you are taking can cause trouble pooping. You may need to take steps to prevent or treat trouble pooping: ? Drink enough fluid to keep your pee (urine) pale yellow. ? Take over-the-counter or prescription medicines. ? Eat foods that are high in fiber. These include beans, whole grains, and fresh fruits and vegetables. ? Limit foods that are high in fat and sugar. These include fried or sweet foods. Incision care   Follow instructions from your doctor about how to take care of your cuts from surgery. Make sure you: ? Wash your hands with soap and water before and after you change your bandage (dressing). If you cannot use soap and water, use hand sanitizer. ? Change your bandage as told by your doctor. ? Leave stitches (sutures), skin glue, or skin tape (adhesive) strips in place. They may need to stay in place for 2 weeks or longer. If tape strips get loose and curl up, you may trim the loose edges. Do not remove tape strips completely unless your doctor says it is okay.  Check your cuts from surgery every day for signs of infection. Check for: ? Redness, swelling, or pain. ? Fluid or  blood. ? Warmth. ? Pus or a bad smell. Bathing  Keep your cuts from surgery clean and dry. Clean them as told by your doctor. To do this: 1. Gently wash the cuts with soap and water. 2. Rinse the cuts with water to remove all soap. 3. Pat the cuts dry with a clean towel. Do not rub the cuts.  Do not take baths, swim, or use a hot tub for 2 weeks, or until your doctor says it is okay.   You may shower.  Activity   Do not drive for 24 hours if you were given a medicine to help you relax (sedative) during your procedure.  Rest after the procedure. Return to your normal activities as told by your doctor. Ask your doctor what activities are safe for you.  For 1-2 weeks: ? Do not lift anything that is heavier than 10 lb (4.5 kg), or the limit that you are told. ? Do not play contact sports. General instructions  If you were sent home  with a drain, follow instructions from your doctor on how to care for it.  Take deep breaths. This helps to keep your lungs from getting an infection (pneumonia).  Keep all follow-up visits as told by your doctor. This is important. Contact a doctor if:  You have redness, swelling, or pain around a cut from surgery.  You have fluid or blood coming from a cut.  Your cut feels warm to the touch.  You have pus or a bad smell coming from a cut or a bandage.  The edges of a cut break open after the stitches have been taken out.  You have pain in your shoulders that gets worse.  You feel dizzy or you pass out (faint).  You have shortness of breath.  You keep feeling sick to your stomach (nauseous).  You keep throwing up (vomiting).  You get watery poop (diarrhea) or you cannot control your poop.  You lose your appetite.  You have swelling or pain in your legs.  You get a rash. Get help right away if:  You have a fever.  You have trouble breathing.  You have sharp pains in your chest. Summary  After the procedure, it is common to  have low energy, mild pain, and trouble pooping.  Infection is a common problem after this procedure. Follow your doctor's instructions about caring for yourself after the procedure.  Rest after the procedure. Return to your normal activities as told by your doctor.  Contact your doctor if you see signs of infection around your cuts from surgery, or you get short of breath. Get help right away if you have a fever, chest pain, or trouble breathing. This information is not intended to replace advice given to you by your health care provider. Make sure you discuss any questions you have with your health care provider. Document Revised: 10/12/2017 Document Reviewed: 10/12/2017 Elsevier Patient Education  2020 [employer]   Vocal Cord Polyps and Nodules  Polyps and nodules are growths that are noncancerous (benign) that can develop on the vocal cords. You have two vocal cords in your voice box (larynx). The vocal cords produce the sound of your voice when air from your lungs passes between them. Polyps or nodules can cause your voice to change. They will often make your voice sound more hoarse or husky. A vocal cord nodule is like a small, hard callus. Vocal cord polyps are usually larger and softer. They can be different shapes and sizes. Polyps usually develop on just one vocal cord. Polyps and nodules do not go away without some type of treatment. What are the causes? Vocal cord polyps or nodules may be caused by:  Using your voice too much. This is the most common cause. It can be from singing or yelling a lot.  Smoking.  Coughing.  Allergies.  Regularly drinking alcohol.  Regularly consuming caffeine.  Using your voice while your vocal cords are inflamed (laryngitis).  Some medicines.  Not having enough thyroid hormone (hypothyroidism).  A condition that causes stomach acid to back up into the esophagus (gastroesophageal reflux disease, GERD). In some cases, the cause may not  be known. What are the signs or symptoms? Symptoms of nodules or polyps may include:  Changes in your voice. Your voice might become: ? Hoarse or husky. ? Weak or breathy. ? Low or gravelly.  Trouble speaking or singing.  A lump in your throat.  Pain in your throat or ear. How is this diagnosed? Vocal cord  nodules or polyps may be diagnosed based on:  Your symptoms and medical history.  A physical exam. Methods to examine your vocal cords include: ? Indirect laryngoscopy. Your health care provider may look at your vocal cords by holding a small mirror at the back of your throat. ? Fiber-optic laryngoscopy. Your health care provider may look at your vocal cords by inserting a small tube with a tiny camera at the end (laryngoscope) into your throat. ? Video stroboscopy. Your health care provider may use a scope with a type of light that allows him or her to see how your vocal cords vibrate. How is this treated? Treatment varies and may include:  Medicines to treat the cause of the polyps or nodules. This may include medicines for coughs, allergies, GERD, or thyroid problems.  Voice therapy. This includes resting your voice and working with a speech therapist. The speech therapist can show you how to use your voice more safely.  Lifestyle changes, such as: ? Quitting smoking. ? Reducing the amount of alcohol you drink. ? Reducing the amount of caffeine you consume. ? Reducing stress. ? Taking steps to help control GERD symptoms.  Surgery to remove the nodules or polyps. This may be needed if other treatments do not work. Follow these instructions at home:  Rest your voice as told by your health care provider or speech therapist.  Drink enough water or other fluids to keep your urine pale yellow.  Do not use any products that contain nicotine or tobacco, such as cigarettes, e-cigarettes, and chewing tobacco. If you need help quitting, ask your health care provider. Also, stay  away from secondhand smoke.  Take over-the-counter and prescription medicines only as told by your health care provider.  Keep all follow-up visits as told by your health care provider. This is important. Contact a health care provider if:  Your voice is hoarse for more than 2 weeks.  You have other voice changes that last for more than 2 weeks.  You have throat pain.  You have a cough that does not go away.  You have a fever. Get help right away if:  You have throat pain that gets worse.  You cough up blood.  You have trouble breathing. Summary  Vocal cord polyps and nodules are noncancerous growths. They are often caused by smoking or voice overuse, but there may be other causes as well.  Treatment may include medicines, vocal therapy, and lifestyle changes, such as quitting smoking. In some cases, surgery may be needed.  Rest your voice as told by your health care provider or speech therapist.  Do not use any products that contain nicotine or tobacco, such as cigarettes, e-cigarettes, and chewing tobacco. If you need help quitting, ask your health care provider. This information is not intended to replace advice given to you by your health care provider. Make sure you discuss any questions you have with your health care provider. Document Revised: 01/16/2018 Document Reviewed: 01/17/2018 Elsevier Patient Education  2020 [employer].

## 2019-07-08 NOTE — Anesthesia Preprocedure Evaluation (Signed)
Anesthesia Evaluation  Patient identified by MRN, date of birth, ID band Patient awake    Reviewed: Allergy & Precautions, NPO status , Patient's Chart, lab work & pertinent test results  Airway Mallampati: III  TM Distance: >3 FB Neck ROM: Full    Dental  (+) Dental Advisory Given, Implants,    Pulmonary former smoker,    Pulmonary exam normal breath sounds clear to auscultation       Cardiovascular Exercise Tolerance: Good Normal cardiovascular exam Rhythm:Regular Rate:Normal     Neuro/Psych negative neurological ROS  negative psych ROS   GI/Hepatic negative GI ROS, (+)     substance abuse (Stopped using drugs 4 years ago)  , Hepatitis -, C  Endo/Other  negative endocrine ROS  Renal/GU Renal disease     Musculoskeletal negative musculoskeletal ROS (+)   Abdominal   Peds  Hematology negative hematology ROS (+)   Anesthesia Other Findings   Reproductive/Obstetrics negative OB ROS                             Anesthesia Physical Anesthesia Plan  ASA: II  Anesthesia Plan: General   Post-op Pain Management:    Induction: Intravenous  PONV Risk Score and Plan: 4 or greater and Ondansetron, Dexamethasone and Midazolam  Airway Management Planned: Oral ETT  Additional Equipment:   Intra-op Plan:   Post-operative Plan: Extubation in OR  Informed Consent: I have reviewed the patients History and Physical, chart, labs and discussed the procedure including the risks, benefits and alternatives for the proposed anesthesia with the patient or authorized representative who has indicated his/her understanding and acceptance.     Dental advisory given  Plan Discussed with: CRNA and Surgeon  Anesthesia Plan Comments:         Anesthesia Quick Evaluation

## 2019-07-08 NOTE — Progress Notes (Signed)
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 Will get correctional facility to arrange ENT follow up as an outpatient.  Patient already reports GI follow up for his Hep C and reported treatment for his Hep C.  Algis Greenhouse, MD [facility] [address] [place], [place] [address] [phone] (office)

## 2019-07-08 NOTE — OR Nursing (Signed)
received 50 mcg of fentanyl ,  Now rating pain a 7

## 2019-07-08 NOTE — Consult Note (Signed)
Reason for Consult: abdominal pain Referring Physician: Dr. Catheryn Jones is an 49 y.o. male.  HPI: Pt presents to the hospital with a 3 day history of abdominal pain located in the right flank. Pt describes the pain as stabbing and states that nothing improves or worsens the pain. He reports that the pain was a 10/10 initially and is now an 8-9/10. Pt reports associated nausea and vomiting and denies diarrhea, constipation, blood in stool, and melena. Pt is NPO and has had no BM since admission to the hospital. He is able to pass flatus.   Past Medical History:  Diagnosis Date  . Arthropathy   . Congenital ptosis   . Hepatic fibrosis   . Hepatitis C   . Kidney stones   . Presbyopia   . Strabismic amblyopia of right eye   . Unequal limb length (acquired), left femur     Past Surgical History:  Procedure Laterality Date  . carpel tunnel     billateral  . ESOPHAGOGASTRODUODENOSCOPY    . EYE SURGERY    . LEG SURGERY      No family history on file.  Social History:  reports that he has quit smoking. He has never used smokeless tobacco. He reports previous alcohol use. He reports previous drug use.  Allergies: No Known Allergies  Medications: I have reviewed the patient's current medications.  Results for orders placed or performed during the hospital encounter of 07/07/19 (from the past 48 hour(s))  CBC     Status: Abnormal   Collection Time: 07/07/19 10:56 PM  Result Value Ref Range   WBC 11.0 (H) 4.0 - 10.5 K/uL   RBC 4.17 (L) 4.22 - 5.81 MIL/uL   Hemoglobin 13.1 13.0 - 17.0 g/dL   HCT 87.5 (L) 64.3 - 32.9 %   MCV 92.3 80.0 - 100.0 fL   MCH 31.4 26.0 - 34.0 pg   MCHC 34.0 30.0 - 36.0 g/dL   RDW 51.8 84.1 - 66.0 %   Platelets 164 150 - 400 K/uL   nRBC 0.0 0.0 - 0.2 %    Comment: Performed at [facility] [address] [place], [place] [address]  Comprehensive metabolic panel     Status: Abnormal   Collection Time: 07/07/19 10:56 PM  Result Value Ref Range    Sodium 136 135 - 145 mmol/L   Potassium 3.7 3.5 - 5.1 mmol/L   Chloride 102 98 - 111 mmol/L   CO2 26 22 - 32 mmol/L   Glucose, Bld 115 (H) 70 - 99 mg/dL    Comment: Glucose reference range applies only to samples taken after fasting for at least 8 hours.   BUN 14 6 - 20 mg/dL   Creatinine, Ser 0.10 0.61 - 1.24 mg/dL   Calcium 9.1 8.9 - 93.2 mg/dL   Total Protein 7.9 6.5 - 8.1 g/dL   Albumin 4.3 3.5 - 5.0 g/dL   AST 23 15 - 41 U/L   ALT 29 0 - 44 U/L   Alkaline Phosphatase 53 38 - 126 U/L   Total Bilirubin 1.0 0.3 - 1.2 mg/dL   GFR calc non Af Amer >60 >60 mL/min   GFR calc Af Amer >60 >60 mL/min   Anion gap 8 5 - 15    Comment: Performed at [facility], [address], [place], [place] [address]  Urinalysis, Routine w reflex microscopic     Status: Abnormal   Collection Time: 07/08/19  1:52 AM  Result Value Ref Range  Color, Urine YELLOW YELLOW   APPearance CLEAR CLEAR   Specific Gravity, Urine >1.046 (H) 1.005 - 1.030   pH 7.0 5.0 - 8.0   Glucose, UA NEGATIVE NEGATIVE mg/dL   Hgb urine dipstick NEGATIVE NEGATIVE   Bilirubin Urine NEGATIVE NEGATIVE   Ketones, ur NEGATIVE NEGATIVE mg/dL   Protein, ur NEGATIVE NEGATIVE mg/dL   Nitrite NEGATIVE NEGATIVE   Leukocytes,Ua NEGATIVE NEGATIVE    Comment: Performed at [facility], [address], [place], [place] [address]  Respiratory Panel by RT PCR (Flu A&B, Covid) - Nasopharyngeal Swab     Status: None   Collection Time: 07/08/19  1:52 AM   Specimen: Nasopharyngeal Swab  Result Value Ref Range   SARS Coronavirus 2 by RT PCR NEGATIVE NEGATIVE    Comment: (NOTE) SARS-CoV-2 target nucleic acids are NOT DETECTED. The SARS-CoV-2 RNA is generally detectable in upper respiratoy specimens during the acute phase of infection. The lowest concentration of SARS-CoV-2 viral copies this assay can detect is 131 copies/mL. A negative result does not preclude SARS-Cov-2 infection and should not be used as the sole basis for treatment or other  patient management decisions. A negative result may occur with  improper specimen collection/handling, submission of specimen other than nasopharyngeal swab, presence of viral mutation(s) within the areas targeted by this assay, and inadequate number of viral copies (<131 copies/mL). A negative result must be combined with clinical observations, patient history, and epidemiological information. The expected result is Negative. Fact Sheet for Patients:  PinkCheek.be Fact Sheet for Healthcare Providers:  GravelBags.it This test is not yet ap proved or cleared by the [place] FDA and  has been authorized for detection and/or diagnosis of SARS-CoV-2 by FDA under an Emergency Use Authorization (EUA). This EUA will remain  in effect (meaning this test can be used) for the duration of the COVID-19 declaration under Section 564(b)(1) of the Act, 21 U.S.C. section 360bbb-3(b)(1), unless the authorization is terminated or revoked sooner.    Influenza A by PCR NEGATIVE NEGATIVE   Influenza B by PCR NEGATIVE NEGATIVE    Comment: (NOTE) The Xpert Xpress SARS-CoV-2/FLU/RSV assay is intended as an aid in  the diagnosis of influenza from Nasopharyngeal swab specimens and  should not be used as a sole basis for treatment. Nasal washings and  aspirates are unacceptable for Xpert Xpress SARS-CoV-2/FLU/RSV  testing. Fact Sheet for Patients: PinkCheek.be Fact Sheet for Healthcare Providers: GravelBags.it This test is not yet approved or cleared by the [place] FDA and  has been authorized for detection and/or diagnosis of SARS-CoV-2 by  FDA under an Emergency Use Authorization (EUA). This EUA will remain  in effect (meaning this test can be used) for the duration of the  Covid-19 declaration under Section 564(b)(1) of the Act, 21  U.S.C. section 360bbb-3(b)(1), unless the  authorization is  terminated or revoked. Performed at [facility], [address], [place], [place] [address]   MRSA PCR Screening     Status: None   Collection Time: 07/08/19  4:15 AM   Specimen: Nasal Mucosa; Nasopharyngeal  Result Value Ref Range   MRSA by PCR NEGATIVE NEGATIVE    Comment:        The GeneXpert MRSA Assay (FDA approved for NASAL specimens only), is one component of a comprehensive MRSA colonization surveillance program. It is not intended to diagnose MRSA infection nor to guide or monitor treatment for MRSA infections. Performed at [facility], [address], [place], [place] [address]   CBC  Status: Abnormal   Collection Time: 07/08/19  4:50 AM  Result Value Ref Range   WBC 9.2 4.0 - 10.5 K/uL   RBC 3.75 (L) 4.22 - 5.81 MIL/uL   Hemoglobin 11.7 (L) 13.0 - 17.0 g/dL   HCT 73.2 (L) 20.2 - 54.2 %   MCV 92.5 80.0 - 100.0 fL   MCH 31.2 26.0 - 34.0 pg   MCHC 33.7 30.0 - 36.0 g/dL   RDW 70.6 23.7 - 62.8 %   Platelets 147 (L) 150 - 400 K/uL   nRBC 0.0 0.0 - 0.2 %    Comment: Performed at [facility], [address], [place], [place] [address]  Comprehensive metabolic panel     Status: Abnormal   Collection Time: 07/08/19  4:50 AM  Result Value Ref Range   Sodium 137 135 - 145 mmol/L   Potassium 3.7 3.5 - 5.1 mmol/L   Chloride 103 98 - 111 mmol/L   CO2 25 22 - 32 mmol/L   Glucose, Bld 119 (H) 70 - 99 mg/dL    Comment: Glucose reference range applies only to samples taken after fasting for at least 8 hours.   BUN 12 6 - 20 mg/dL   Creatinine, Ser 6.16 0.61 - 1.24 mg/dL   Calcium 8.7 (L) 8.9 - 10.3 mg/dL   Total Protein 7.0 6.5 - 8.1 g/dL   Albumin 3.8 3.5 - 5.0 g/dL   AST 23 15 - 41 U/L   ALT 28 0 - 44 U/L   Alkaline Phosphatase 48 38 - 126 U/L   Total Bilirubin 1.3 (H) 0.3 - 1.2 mg/dL   GFR calc non Af Amer >60 >60 mL/min   GFR calc Af Amer >60 >60 mL/min   Anion gap 9 5 - 15    Comment: Performed at [facility], [address], [place],  [place] [address]    CT ABDOMEN PELVIS W CONTRAST  Result Date: 07/08/2019 CLINICAL DATA:  Right lower quadrant pain EXAM: CT ABDOMEN AND PELVIS WITH CONTRAST TECHNIQUE: Multidetector CT imaging of the abdomen and pelvis was performed using the standard protocol following bolus administration of intravenous contrast. CONTRAST:  OMNIPAQUE IOHEXOL 300 MG/ML  SOLN COMPARISON:  CT 05/21/2019 FINDINGS: Lower chest: Bibasilar areas of atelectasis. No consolidation or effusion. Normal heart size. No pericardial effusion. Few coronary artery calcifications are present. Hepatobiliary: Diffuse hepatic hypoattenuation compatible with hepatic steatosis. No focal liver abnormality is seen. No gallstones, gallbladder wall thickening, or biliary dilatation. Pancreas: Unremarkable. No pancreatic ductal dilatation or surrounding inflammatory changes. Spleen: Normal in size without focal abnormality. Adrenals/Urinary Tract: Normal adrenal glands. Stable mild bilateral symmetric perinephric stranding, a nonspecific finding though may correlate with either age or decreased renal function. Few subcentimeter hypoattenuating foci in the kidneys too small to fully characterize on CT imaging but statistically likely benign. Kidneys are otherwise unremarkable, without renal calculi, suspicious lesion, or hydronephrosis. Bladder is unremarkable. Stomach/Bowel: Distal esophagus, stomach and duodenal sweep are unremarkable. No proximal small bowel wall thickening or dilatation. There is an edematous, fluid-filled and hyperemic appendix in the right lower quadrant measuring up to 12 mm in diameter (2/65). There is extensive surrounding phlegmonous change and some reactive thickening of the adjacent ileum and cecum. No extraluminal gas, organized collection or abscess is seen. Remaining portions of the colon are unremarkable. Vascular/Lymphatic: Atherosclerotic plaque within the normal caliber aorta. Reactive adenopathy in the right lower  quadrant. Otherwise, no concerning abdominopelvic lymph nodes. Reproductive: The prostate and seminal vesicles are unremarkable. Other: Stranding  and phlegmonous change and small amount of likely reactive free fluid in the right lower quadrant layering within the deep pelvis. Musculoskeletal: Minimal degenerative changes in the spine. Moderate degenerative features noted in the bilateral hips with periacetabular spurring. No acute or worrisome osseous lesions. IMPRESSION: 1. Acute appendicitis with extensive surrounding phlegmonous change and small amount of likely reactive free fluid in the right lower quadrant. Slight reactive thickening of the adjacent cecum and terminal ileum. No convincing features of perforation such as extraluminal gas, organized collection or abscess is seen. 2. Hepatic steatosis. 3. Coronary artery calcifications. 4. Aortic Atherosclerosis (ICD10-I70.0). Electronically Signed   By: Kreg Shropshire M.D.   On: 07/08/2019 01:00    ROS:  Pertinent items are noted in HPI.  Blood pressure 114/69, pulse 69, temperature 98.9 F (37.2 C), temperature source Oral, resp. rate 20, height 5\' 10"  (1.778 m), weight 115.7 kg, SpO2 99 %. Physical Exam:  Cardio: S1 and S2 normal, RRR. No gallops, rubs, or murmurs. Resp: Lungs are CTA bilaterally. Pt winces upon sitting up for the lung exam. Abdominal: Abdomen is soft with normoactive bowel sounds. Tenderness is present on the right flank upon gentle palpation and absent in the RUQ, LUQ, and LLQ. Rovsing's sign, obturator sign, and psoas sign are negative.  Assessment/Plan: Assessment: Pt is a 49-yr-old male with severe abdominal pain in the RLQ. Location and severity of the pain, along with associated symptoms of nausea and vomiting, support the diagnosis of appendicitis. CT scan shows acute appendicitis. Plan: Laparoscopic appendectomy will be performed. Administer IV pain medication and antibiotics as needed post-op.  07/08/2019, 9:44 AM

## 2019-07-08 NOTE — Op Note (Signed)
[facility]  Date of Surgery: 07/08/2019  Admit Date: 07/07/2019   Performing Service: General  Surgeon(s) and Role:    * Virl Cagey, MD - Primary   Pre-operative Diagnosis: Acute Appendicitis  Post-operative Diagnosis: Acute Appendicitis  Procedure Performed: Laparoscopic Appendectomy   Surgeon: Lanell Matar. Constance Haw, MD   Assistant: No qualified resident was available.   Anesthesia: General   Findings:  The appendix was found to be inflamed. There were not signs of necrosis. There was not perforation. There was not abscess formation.   Estimated Blood Loss: Minimal   Specimens:  ID Type Source Tests Collected by Time Destination  1 : appendix Tissue PATH Appendix SURGICAL PATHOLOGY Virl Cagey, MD 5/68/1275 1700      Complications: None; patient tolerated the procedure well.   Disposition: PACU - hemodynamically stable.   Condition: stable   Indications: The patient presented with a 2 day history of right-sided abdominal pain. A CT revealed findings consistent with acute appendicitis.   Procedure Details  Prior to the procedure, the risks, benefits, complications, treatment options, and expected outcomes were discussed with the patient and/or family, including but not limited to the risk of bleeding, infection, finding of a normal appendix, and the need for conversion to an open procedure. There was concurrence with the proposed plan and informed consent was obtained. The patient was taken to the operating room, identified as Darius Jones and the procedure verified as Laproscopic Appendectomy.    The patient was placed in the supine position and general anesthesia was induced, along with placement of orogastric tube, SCD's, and a Foley catheter. The abdomen was prepped and draped in a sterile fashion. The abdomen was entered with Veress technique in the infraumbilical incision. Intraperitoneal placement was confirmed with saline drop, low entry  pressures, and easy insufflation. A 11 mm optiview trocar was placed under direct visualization with a 0 degree scope. The 10 mm 0 degree scope was placed in the abdomen and no evidence of injury was identified. A 12 mm port was placed in the left lower quadrant of the abdomen after skin incision with trocar placement under direct vision. A careful evaluation of the entire abdomen was carried out. An additional 5 mm port was placed in the suprapubic area under direct vision.  The patient was placed in Trendelenburg and left lateral decubitus position. The small intestines were retracted in the cephalad and left lateral direction away from the pelvis and right lower quadrant. The patient was found to have a dilated inflamed appendix. There was not evidence of perforation.   The appendix was carefully dissected. A window was made in the mesoappendix at the base of the appendix. The appendix was divided at its base using a standard endo-GIA stapler. Minimal appendiceal stump was left in place. The mesoappendix was taken with the harmonic energy device. The appendix was placed within an Endocatch specimen bag. There was no evidence of bleeding, leakage, or complication after division of the appendix.  Any remaining blood or pus was suctioned out from the abdomen, hemostasis was confirmed. The endocatch bag was removed via the 12 mm port, then the abdomen desufflated. The appendix was passed off the field as a specimen.   The the 12 mm and 10 mm port sites were closed with a 0 Vicryl suture. The trocar site skin wounds were closed using subcuticular 4-0 Monocryl suture and dermabond. The patient was then awakened from general anesthesia, extubated, and taken to PACU for recovery.  Instrument, sponge, and needle counts were correct at the conclusion of the case.   Darius Greenhouse, MD [facility] [address] [place], [place] [address] [phone])

## 2019-07-08 NOTE — H&P (Signed)
History and Physical    Darius Jones NUU:[id number] DOB: 1970-10-11 DOA: 07/07/2019  PCP: Patient, No Pcp Per (Confirm with patient/family/NH records and if not entered, this has to be entered at [facility] point of entry)  Patient coming from: [place]  I have personally briefly reviewed patient's old medical records in [place]  Chief Complaint: Right lower quadrant abdominal pain  HPI: Darius Jones is a 49 y.o. male with medical history significant of hepatic fibrosis, hepatitis C, presbyopia and congenital ptosis presented to ED for evaluation of abdominal pain.  Patient states that the pain started suddenly yesterday and right lower quadrant and continue to worsen slowly.  It is colicky in nature, 8 out of 10 on pain scale, radiating to her right lateral abdomen, getting better and worse with nothing and associated with nausea but no vomiting.  Patient also denies fever,, chills, chest pain, shortness of breath, constipation, loose stools and urinary symptoms.  ED Course: On arrival to the ED patient had temperature of 99.1, blood pressure 135/83, heart rate 105, respiratory rate 16 and oxygen saturation 99% on room air.  Blood work showed WBC count of 11, hemoglobin 13.1, sodium 136, potassium 3.7, BUN 14, creatinine 0.89 and glucose 115.  CT abdomen was positive for acute appendicitis.  ED physician contacted general surgery and they recommended to start the patient on IV Zosyn and will see the patient in the morning.  Patient is also given IV fluids, IV morphine and Zofran in the ED.  Review of Systems: As per HPI otherwise 10 point review of systems negative.    Past Medical History:  Diagnosis Date  . Arthropathy   . Congenital ptosis   . Hepatic fibrosis   . Hepatitis C   . Kidney stones   . Presbyopia   . Strabismic amblyopia of right eye   . Unequal limb length (acquired), left femur     Past Surgical History:  Procedure Laterality Date  . carpel tunnel     billateral  .  ESOPHAGOGASTRODUODENOSCOPY    . EYE SURGERY    . LEG SURGERY       reports that he has quit smoking. He has never used smokeless tobacco. He reports previous alcohol use. He reports previous drug use.  No Known Allergies  No family history on file. Family history is reviewed and not pertinent  Prior to Admission medications   Medication Sig Start Date End Date Taking? Authorizing Provider  acetaminophen (TYLENOL) 650 MG CR tablet Take 650 mg by mouth every 8 (eight) hours as needed for pain.    [provider]  amoxicillin-clavulanate (AUGMENTIN) 875-125 MG tablet Take 1 tablet by mouth 2 (two) times daily. 10 day course starting on 12/19/2018    [provider]  HYDROcodone-acetaminophen (NORCO/VICODIN) 5-325 MG tablet Take 2 tablets by mouth every 4 (four) hours as needed. 12/28/18   Fransico Meadow, PA-C  ibuprofen (ADVIL) 800 MG tablet Take 800 mg by mouth every 8 (eight) hours as needed for mild pain or moderate pain.     [provider]  ondansetron (ZOFRAN ODT) 4 MG disintegrating tablet Take 1 tablet (4 mg total) by mouth every 8 (eight) hours as needed for nausea or vomiting. 12/28/18   Fransico Meadow, PA-C  tamsulosin (FLOMAX) 0.4 MG CAPS capsule Take 1 capsule (0.4 mg total) by mouth daily. 12/29/18   Fransico Meadow, PA-C    Physical Exam: Vitals:   07/07/19 2300 07/08/19 0217 07/08/19 0230 07/08/19 3474  BP: 124/77 111/67 (!) 99/58 114/65  Pulse: 84  73 67  Resp:   15 20  Temp:    99 F (37.2 C)  TempSrc:    Oral  SpO2: 99%  94% 99%  Weight:      Height:        Constitutional: NAD, calm, comfortable Vitals:   07/07/19 2300 07/08/19 0217 07/08/19 0230 07/08/19 0334  BP: 124/77 111/67 (!) 99/58 114/65  Pulse: 84  73 67  Resp:   15 20  Temp:    99 F (37.2 C)  TempSrc:    Oral  SpO2: 99%  94% 99%  Weight:      Height:         General: Patient is a 49 year old male, not in acute distress. Eyes: PERRL, lids and conjunctivae  normal ENMT: Mucous membranes are moist. Posterior pharynx clear of any exudate or lesions.Normal dentition.  Neck: normal, supple, no masses, no thyromegaly Respiratory: clear to auscultation bilaterally, no wheezing, no crackles. Normal respiratory effort. No accessory muscle use.  Cardiovascular: Regular rate and rhythm, no murmurs / rubs / gallops. No extremity edema. 2+ pedal pulses. No carotid bruits.  Abdomen: Abdominal tenderness in right lower quadrant.  There is guarding and rebound. No masses palpated. No hepatosplenomegaly. Bowel sounds positive.  Musculoskeletal: no clubbing / cyanosis. No joint deformity upper and lower extremities. Good ROM, no contractures. Normal muscle tone.  Skin: no rashes, lesions, ulcers. No induration Neurologic: CN 2-12 grossly intact. Sensation intact, DTR normal. Strength 5/5 in all 4.  Psychiatric: Normal judgment and insight. Alert and oriented x 3. Normal mood.    Labs on Admission: I have personally reviewed following labs and imaging studies  CBC: Recent Labs  Lab 07/07/19 2256  WBC 11.0*  HGB 13.1  HCT 38.5*  MCV 92.3  PLT 164   Basic Metabolic Panel: Recent Labs  Lab 07/07/19 2256  NA 136  K 3.7  CL 102  CO2 26  GLUCOSE 115*  BUN 14  CREATININE 0.89  CALCIUM 9.1   GFR: Estimated Creatinine Clearance: 125.1 mL/min (by C-G formula based on SCr of 0.89 mg/dL). Liver Function Tests: Recent Labs  Lab 07/07/19 2256  AST 23  ALT 29  ALKPHOS 53  BILITOT 1.0  PROT 7.9  ALBUMIN 4.3   No results for input(s): LIPASE, AMYLASE in the last 168 hours. No results for input(s): AMMONIA in the last 168 hours. Coagulation Profile: No results for input(s): INR, PROTIME in the last 168 hours. Cardiac Enzymes: No results for input(s): CKTOTAL, CKMB, CKMBINDEX, TROPONINI in the last 168 hours. BNP (last 3 results) No results for input(s): PROBNP in the last 8760 hours. HbA1C: No results for input(s): HGBA1C in the last 72  hours. CBG: No results for input(s): GLUCAP in the last 168 hours. Lipid Profile: No results for input(s): CHOL, HDL, LDLCALC, TRIG, CHOLHDL, LDLDIRECT in the last 72 hours. Thyroid Function Tests: No results for input(s): TSH, T4TOTAL, FREET4, T3FREE, THYROIDAB in the last 72 hours. Anemia Panel: No results for input(s): VITAMINB12, FOLATE, FERRITIN, TIBC, IRON, RETICCTPCT in the last 72 hours. Urine analysis:    Component Value Date/Time   COLORURINE YELLOW 07/08/2019 0152   APPEARANCEUR CLEAR 07/08/2019 0152   LABSPEC >1.046 (H) 07/08/2019 0152   PHURINE 7.0 07/08/2019 0152   GLUCOSEU NEGATIVE 07/08/2019 0152   HGBUR NEGATIVE 07/08/2019 0152   BILIRUBINUR NEGATIVE 07/08/2019 0152   KETONESUR NEGATIVE 07/08/2019 0152   PROTEINUR NEGATIVE 07/08/2019 0152   NITRITE  NEGATIVE 07/08/2019 0152   LEUKOCYTESUR NEGATIVE 07/08/2019 0152    Radiological Exams on Admission: CT ABDOMEN PELVIS W CONTRAST  Result Date: 07/08/2019 CLINICAL DATA:  Right lower quadrant pain EXAM: CT ABDOMEN AND PELVIS WITH CONTRAST TECHNIQUE: Multidetector CT imaging of the abdomen and pelvis was performed using the standard protocol following bolus administration of intravenous contrast. CONTRAST:  OMNIPAQUE IOHEXOL 300 MG/ML  SOLN COMPARISON:  CT 05/21/2019 FINDINGS: Lower chest: Bibasilar areas of atelectasis. No consolidation or effusion. Normal heart size. No pericardial effusion. Few coronary artery calcifications are present. Hepatobiliary: Diffuse hepatic hypoattenuation compatible with hepatic steatosis. No focal liver abnormality is seen. No gallstones, gallbladder wall thickening, or biliary dilatation. Pancreas: Unremarkable. No pancreatic ductal dilatation or surrounding inflammatory changes. Spleen: Normal in size without focal abnormality. Adrenals/Urinary Tract: Normal adrenal glands. Stable mild bilateral symmetric perinephric stranding, a nonspecific finding though may correlate with either age or  decreased renal function. Few subcentimeter hypoattenuating foci in the kidneys too small to fully characterize on CT imaging but statistically likely benign. Kidneys are otherwise unremarkable, without renal calculi, suspicious lesion, or hydronephrosis. Bladder is unremarkable. Stomach/Bowel: Distal esophagus, stomach and duodenal sweep are unremarkable. No proximal small bowel wall thickening or dilatation. There is an edematous, fluid-filled and hyperemic appendix in the right lower quadrant measuring up to 12 mm in diameter (2/65). There is extensive surrounding phlegmonous change and some reactive thickening of the adjacent ileum and cecum. No extraluminal gas, organized collection or abscess is seen. Remaining portions of the colon are unremarkable. Vascular/Lymphatic: Atherosclerotic plaque within the normal caliber aorta. Reactive adenopathy in the right lower quadrant. Otherwise, no concerning abdominopelvic lymph nodes. Reproductive: The prostate and seminal vesicles are unremarkable. Other: Stranding and phlegmonous change and small amount of likely reactive free fluid in the right lower quadrant layering within the deep pelvis. Musculoskeletal: Minimal degenerative changes in the spine. Moderate degenerative features noted in the bilateral hips with periacetabular spurring. No acute or worrisome osseous lesions. IMPRESSION: 1. Acute appendicitis with extensive surrounding phlegmonous change and small amount of likely reactive free fluid in the right lower quadrant. Slight reactive thickening of the adjacent cecum and terminal ileum. No convincing features of perforation such as extraluminal gas, organized collection or abscess is seen. 2. Hepatic steatosis. 3. Coronary artery calcifications. 4. Aortic Atherosclerosis (ICD10-I70.0). Electronically Signed   By: Kreg Shropshire M.D.   On: 07/08/2019 01:00    EKG:   Assessment/Plan Principal Problem:    Acute appendicitis with localized peritonitis,  without gangrene or abscess CT abdomen showed acute appendicitis General surgery consult ordered by the ED physician. Continue IV fluids. Continue IV Zosyn. N.p.o. IV morphine 2 mg every 4 hour as needed for moderate to severe pain. IV Zosyn for nausea and vomiting.   DVT prophylaxis: Heparin Code Status: Full code  Consults called: General surgery consulted by ED physician  Admission status: Inpatient/MedSurg   Thalia Party MD [facility] Pager [phone]   If 7PM-7AM, please contact night-coverage [email or web address] Password   07/08/2019, 4:05 AM

## 2019-07-08 NOTE — OR Nursing (Signed)
Pain level 10 ,  fentynal given per Dr. Pilar Plate order .

## 2019-07-09 DIAGNOSIS — B182 Chronic viral hepatitis C: Secondary | ICD-10-CM | POA: Diagnosis present

## 2019-07-09 DIAGNOSIS — Z9049 Acquired absence of other specified parts of digestive tract: Secondary | ICD-10-CM

## 2019-07-09 DIAGNOSIS — J382 Nodules of vocal cords: Secondary | ICD-10-CM | POA: Diagnosis present

## 2019-07-09 LAB — CBC
HCT: 33.4 % — ABNORMAL LOW (ref 39.0–52.0)
Hemoglobin: 11.4 g/dL — ABNORMAL LOW (ref 13.0–17.0)
MCH: 31.1 pg (ref 26.0–34.0)
MCHC: 34.1 g/dL (ref 30.0–36.0)
MCV: 91 fL (ref 80.0–100.0)
Platelets: 129 10*3/uL — ABNORMAL LOW (ref 150–400)
RBC: 3.67 MIL/uL — ABNORMAL LOW (ref 4.22–5.81)
RDW: 12.7 % (ref 11.5–15.5)
WBC: 12 10*3/uL — ABNORMAL HIGH (ref 4.0–10.5)
nRBC: 0 % (ref 0.0–0.2)

## 2019-07-09 LAB — SURGICAL PATHOLOGY

## 2019-07-09 MED ORDER — OXYCODONE HCL 5 MG PO TABS: 5.0000 mg | ORAL_TABLET | ORAL | 0 refills | Status: AC | PRN

## 2019-07-09 MED ORDER — ONDANSETRON HCL 4 MG PO TABS: 4.0000 mg | ORAL_TABLET | Freq: Four times a day (QID) | ORAL | 0 refills | Status: AC | PRN

## 2019-07-09 MED ORDER — DOCUSATE SODIUM 100 MG PO CAPS: 100.0000 mg | ORAL_CAPSULE | Freq: Two times a day (BID) | ORAL | 0 refills | Status: AC

## 2019-07-09 NOTE — Discharge Summary (Addendum)
Physician Discharge Summary  Patient ID: Darius Jones MRN: [id number] DOB/AGE: 09-22-1970 49 y.o.  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 He was worked up and found to have acute appendicitis on CT scan. He was brought into the hospital and had IV antibiotics started. He was taken to surgery for a laparoscopic appendectomy.  During his procedure he was noted to have a vocal cord polyp by the anesthesia team.  Post operatively, he did well. He is tolerating a diet and has no nausea. His pain is controlled.  He can shower but not submerge in a bath for 4 weeks. He should take stool softener while on narcotics.   With COVID we have been doing post operative phone calls, and I will call the facility on 07/30/2019, Tuesday, to check on the patient and notify of the pathology report. The officer gave me [phone] Ext [phone] (medical extension) and told me to ask for Darius Jones Therapist, sports, who works M-F 6am- 2pm.   Patient has Hep C and reported getting treatment. He will need follow up for this treatment.   Consults: Hospitalist admitted-surgery took over after surgery   Significant Diagnostic Studies: CT with acute appendicitis; Intubation with vocal cord polyp- Needs ENT follow up   Treatments: IV hydration, antibiotics: Zosyn and surgery: Laparoscopic appendectomy 07/08/2019  Discharge Exam: Blood pressure 111/68, pulse (!) 55, temperature 97.8 F (36.6 C),  temperature source Oral, resp. rate 18, height 5\' 10"  (1.778 m), weight 115 kg, SpO2 97 %. General appearance: alert, cooperative and no distress Resp: normal work of breathing GI: soft, mildly distended, appropriately tender, incisions c/d/i with dermabond, no erythema or drainage Extremities: extremities normal, atraumatic, no cyanosis or edema  Disposition: Discharge disposition: 01-Home or Self Care       Discharge Instructions    Call MD for:  difficulty breathing, headache or visual disturbances   Complete by: As directed    Call MD for:  extreme fatigue   Complete by: As directed    Call MD for:  persistant dizziness or light-headedness   Complete by: As directed    Call MD for:  persistant nausea and vomiting   Complete by: As directed    Call MD for:  redness, tenderness, or signs of infection (pain, swelling, redness, odor or green/yellow discharge around incision site)   Complete by: As directed    Call MD for:  severe uncontrolled pain   Complete by: As directed    Call MD for:  temperature >100.4   Complete by: As directed    Increase activity slowly   Complete by: As directed      Allergies as of 07/09/2019      Reactions   Lovastatin Other (See Comments)   Gives patient migraines      Medication List    TAKE these medications   acetaminophen 650 MG CR tablet Commonly known as: TYLENOL Take 650 mg by mouth every 8 (eight) hours as needed for pain.   carvedilol 3.125 MG tablet Commonly known as: COREG Take  3.125 mg by mouth 2 (two) times daily with a meal.   docusate sodium 100 MG capsule Commonly known as: COLACE Take 1 capsule (100 mg total) by mouth 2 (two) times daily. While taking narcotic pain medication   hydrochlorothiazide 25 MG tablet Commonly known as: HYDRODIURIL Take 25 mg by mouth daily.   ondansetron 4 MG tablet Commonly known as: ZOFRAN Take 1 tablet (4 mg total) by mouth every 6 (six) hours as needed for nausea.   oxyCODONE 5  MG immediate release tablet Commonly known as: Oxy IR/ROXICODONE Take 1 tablet (5 mg total) by mouth every 4 (four) hours as needed for severe pain or breakthrough pain.      Follow-up Information    Darius Roers, MD Follow up on 07/23/2019.   Specialty: General Surgery Why: Dr. Henreitta Leber will call to check on your at the facility [phone] Ext [phone]); if you need to be seen in person, please let the office know. Contact information: [address] [facility] [address] [phone]        Ent, Physician, MD.   Specialty: Otolaryngology Why: You need to be seen by ENT at some point. A vocal cord nodule was noted, and this should be checked and monitored. These are usually benign.  The correctional facility can send you to the ENT of their choice.  Contact information: [address] [place] [address] [phone]           Signed: Lucretia Jones 07/09/2019, 10:53 AM

## 2019-07-09 NOTE — Progress Notes (Signed)
Report given to Nurse Thurmond Butts at [facility] at this time. Follow up appointments and medication directions explained.

## 2019-07-09 NOTE — Progress Notes (Addendum)
  Patient Active Problem List   Diagnosis Date Noted  . Acute appendicitis with localized peritonitis, without gangrene or abscess 07/08/2019    Priority: High  . Chronic hepatitis C without hepatic coma -Needs GI Follow-up at [facility] 07/09/2019    Priority: Medium  . 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 Status post laparoscopic appendectomy-- 07/07/19 07/09/2019      Notified by general surgeon Dr. Lucinda Dell patient has been transferred to general surgery service  -General surgery will reconsult hospitalist service if needed  -Hospitalist service will sign off  Shon Hale, MD

## 2019-07-09 NOTE — Progress Notes (Signed)
Discharge instructions reviewed with patient. AVS and prescriptions placed in discharge packet to be sent with patient back to correctional facility. Follow-up instructions on AVS. Patient requested to eat lunch prior to discharge, guard at bedside stated that would be fine. Patient's assigned RN called report to nurse at correctional facility as requested.

## 2019-07-09 NOTE — TOC Transition Note (Signed)
Transition of Care [facility]) - CM/SW Discharge Note   Patient Details  Name: Darius Jones MRN: [id number] Date of Birth: June 16, 1970  Transition of Care [facility]) CM/SW Contact:  Mignon Bechler Sherryle Lis, LCSW Phone Number: 07/09/2019, 9:32 AM   Clinical Narrative:   Patient is a 49 yo male admitted on 07/07/2019 with a dx of acute appendicitis. Patient will be discharging today on 07/09/2019. CSW in contact with correctional facility nursing staff [facility]: [phone] to notify them of patient discharge and return to correctional facility. Nurse Thurmond Butts confirmed receipt of this notification. CSW later in contact with nurse ashley to inform her of whom to speak with to give report.   No further needs at this time.   Darius Jones Sherryle Lis LCSWA Transitions of Care  Clinical Social Worker  Ph: [phone]  Final next level of care: Corrections Facility Barriers to Discharge: No Barriers Identified   Patient Goals and CMS Choice Patient states their goals for this hospitalization and ongoing recovery are:: to get well and later return to corrections facility      Discharge Placement                Patient to be transferred to facility by: [facility] Name of family member notified: Nurse Thurmond Butts at corrections facility Patient and family notified of of transfer: 07/09/19  Discharge Plan and Services                                     Social Determinants of Health (SDOH) Interventions     Readmission Risk Interventions No flowsheet data found.

## 2019-07-09 NOTE — Progress Notes (Signed)
Triage nurse called at this time as requested by patient's bedside guard. Triage stated he will be able to return to [facility].

## 2019-07-30 ENCOUNTER — Telehealth (INDEPENDENT_AMBULATORY_CARE_PROVIDER_SITE_OTHER): Payer: Self-pay | Admitting: General Surgery

## 2019-07-30 ENCOUNTER — Other Ambulatory Visit: Payer: Self-pay

## 2019-07-30 DIAGNOSIS — K353 Acute appendicitis with localized peritonitis, without perforation or gangrene: Secondary | ICD-10-CM

## 2019-07-30 NOTE — Progress Notes (Signed)
[facility]  [facility]  I am calling the patient for post operative evaluation due to the current COVID 19 pandemic.  The patient had a laparoscopic appendectomy on 07/08/2019. He is a prisoner and I was instructed to call and speak with RN Willis at [phone] extensive [phone].   Dorey, RN spoke with me and says he has been doing well. He has not had any complaints that she sees in his chart.  I have notified her that his pathology was expected and normal.   The prison Doctor saw him and everything was looking good, BP was 157/99 but nothing else abnormal was reported.   Will see the patient PRN.   Pathology: FINAL MICROSCOPIC DIAGNOSIS:   A. APPENDIX, APPENDECTOMY:  - Acute and suppurative appendicitis and serositis.  - There is no evidence of malignancy.   We will send the pathology report and today's note to the prison fax [phone].   Algis Greenhouse, MD [facility] [address] [place], [place] [address] [phone] (office)

## 2021-07-22 IMAGING — CT CT ABD-PELV W/ CM
2 of 5 series · 15 of 46 positions shown, 17 images · IV contrast (Omnipaque or Isovue)
Comparison: CT 05/21/2019

CLINICAL DATA: Right lower quadrant pain

EXAM:
CT ABDOMEN AND PELVIS WITH CONTRAST
TECHNIQUE: Multidetector CT imaging of the abdomen and pelvis was performed
using the standard protocol following bolus administration of
intravenous contrast.
CONTRAST:  100mL OMNIPAQUE IOHEXOL 300 MG/ML  SOLN

[Series 2: axial st · axial · 0.98mm/px · z∈[+807,+1252]mm · 12 of 107 slices shown, 14 images]
[im 9/107  soft-tissue]
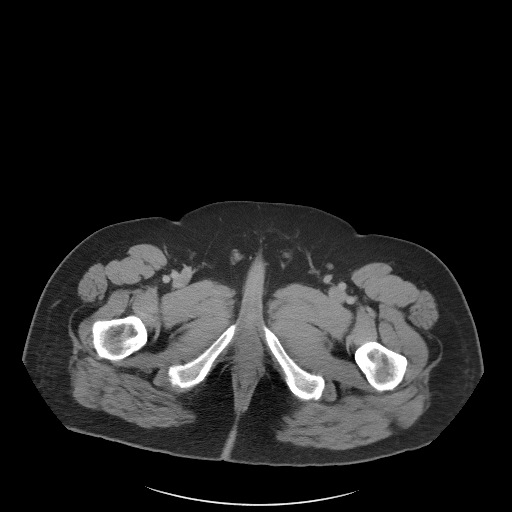
[im 9/107  bone]
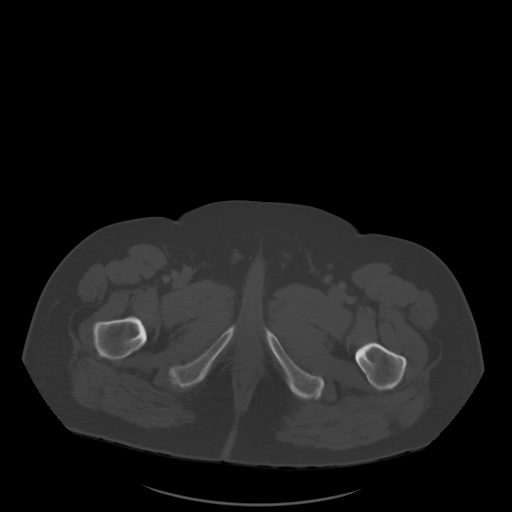
[im 17/107  soft-tissue]
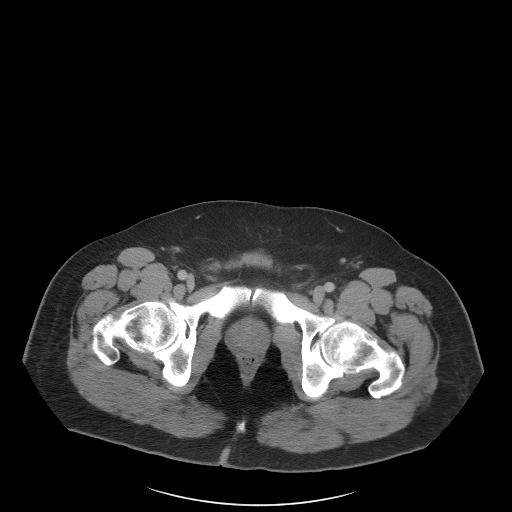
[im 25/107  soft-tissue]
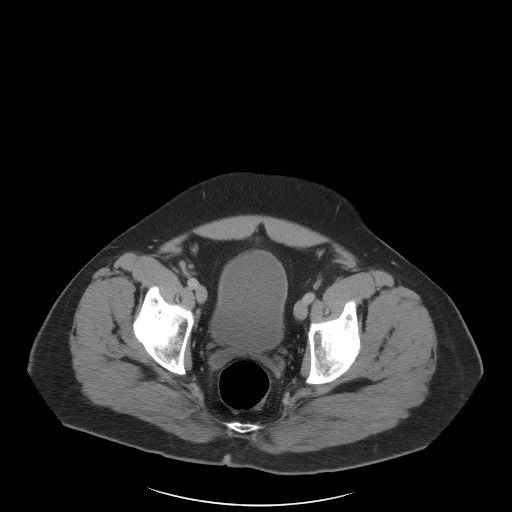
[im 33/107  soft-tissue]
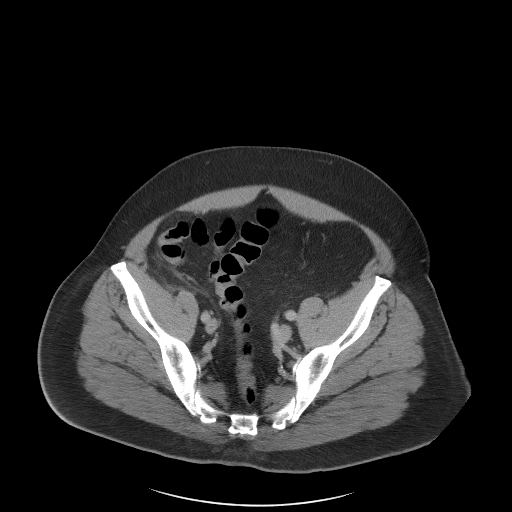
[im 41/107  soft-tissue]
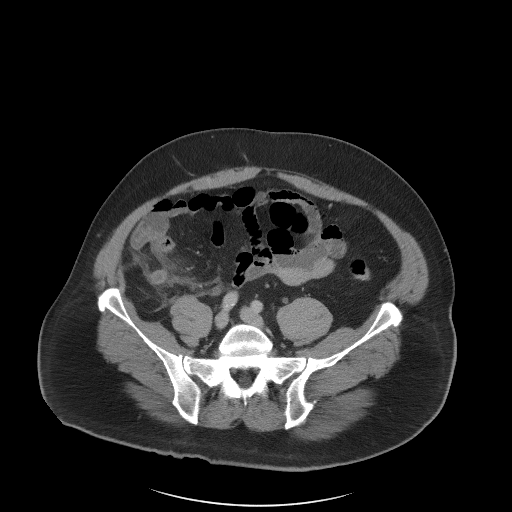
[im 49/107  soft-tissue]
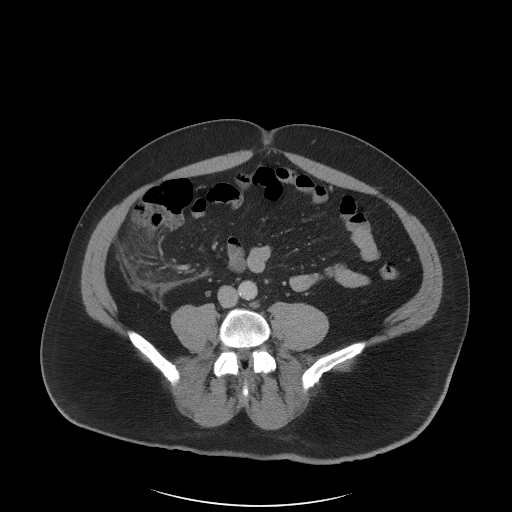
[im 58/107  soft-tissue]
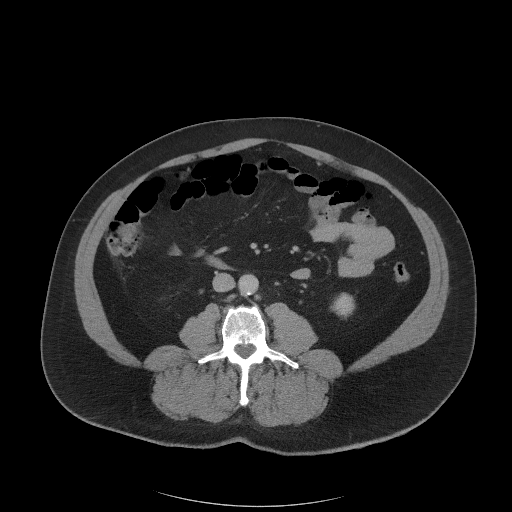
[im 66/107  soft-tissue]
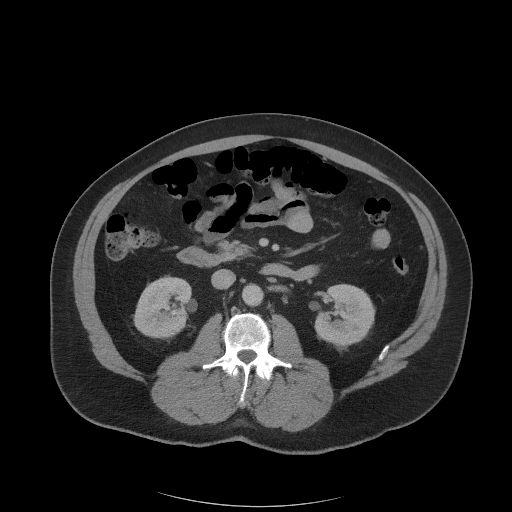
[im 74/107  soft-tissue]
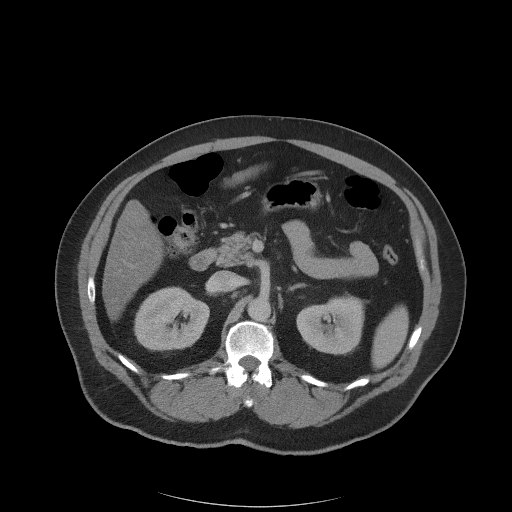
[im 74/107  bone]
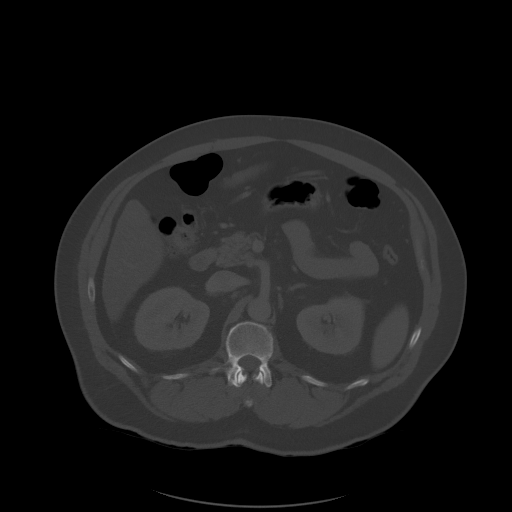
[im 82/107  soft-tissue]
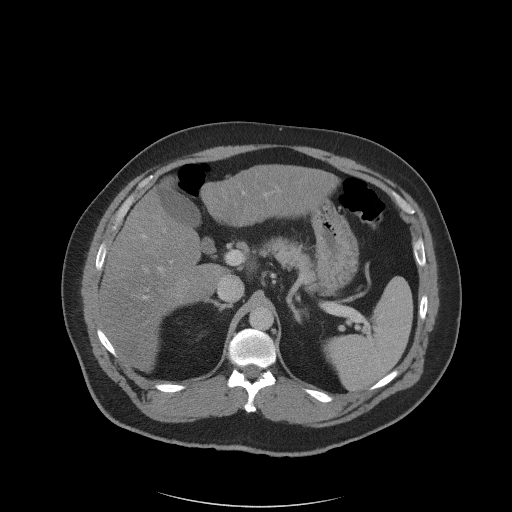
[im 90/107  soft-tissue]
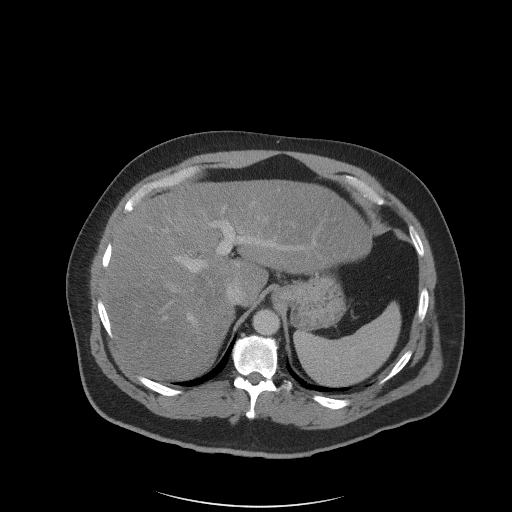
[im 98/107  soft-tissue]
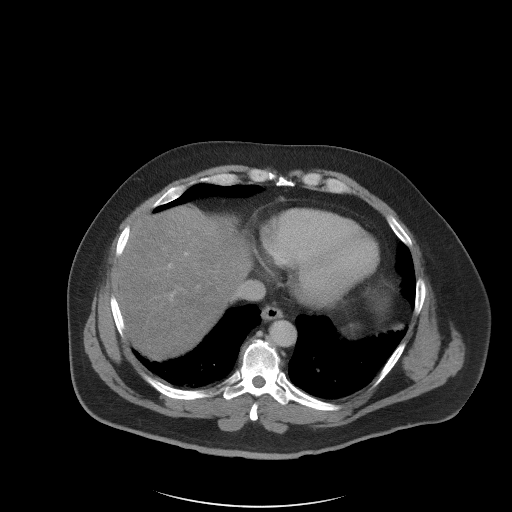

[Series 5: coronal st · coronal · 0.93mm/px · 3 of 124 slices shown]
[im 42/124  soft-tissue]
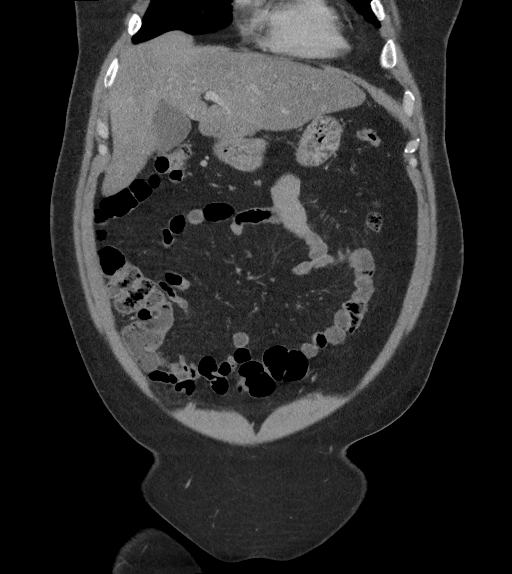
[im 55/124  soft-tissue]
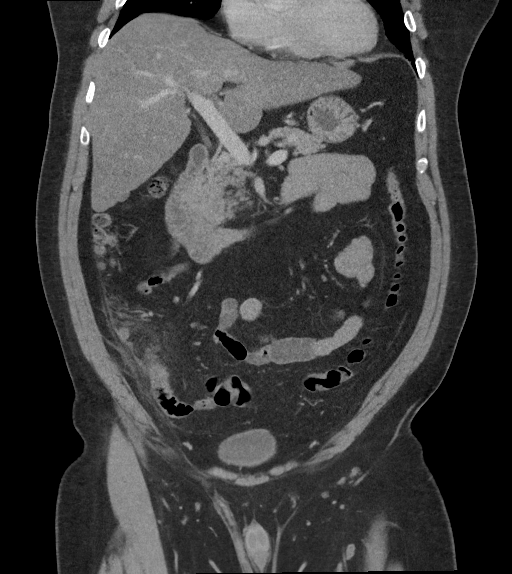
[im 69/124  soft-tissue]
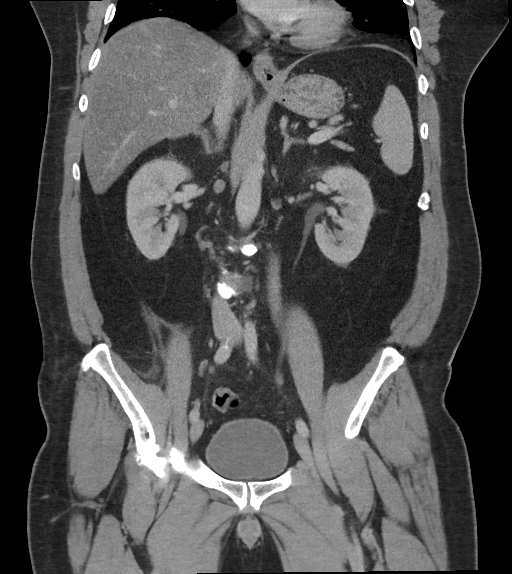

[15 of 46 positions shown; findings below may reference images not displayed]

FINDINGS: Lower chest: Bibasilar areas of atelectasis. No consolidation or
effusion. Normal heart size. No pericardial effusion. Few coronary
artery calcifications are present.

Hepatobiliary: Diffuse hepatic hypoattenuation compatible with
hepatic steatosis. No focal liver abnormality is seen. No
gallstones, gallbladder wall thickening, or biliary dilatation.

Pancreas: Unremarkable. No pancreatic ductal dilatation or
surrounding inflammatory changes.

Spleen: Normal in size without focal abnormality.

Adrenals/Urinary Tract: Normal adrenal glands. Stable mild bilateral
symmetric perinephric stranding, a nonspecific finding though may
correlate with either age or decreased renal function. Few
subcentimeter hypoattenuating foci in the kidneys too small to fully
characterize on CT imaging but statistically likely benign. Kidneys
are otherwise unremarkable, without renal calculi, suspicious
lesion, or hydronephrosis. Bladder is unremarkable.

Stomach/Bowel: Distal esophagus, stomach and duodenal sweep are
unremarkable. No proximal small bowel wall thickening or dilatation.
There is an edematous, fluid-filled and hyperemic appendix in the
right lower quadrant measuring up to 12 mm in diameter (2/65). There
is extensive surrounding phlegmonous change and some reactive
thickening of the adjacent ileum and cecum. No extraluminal gas,
organized collection or abscess is seen. Remaining portions of the
colon are unremarkable.

Vascular/Lymphatic: Atherosclerotic plaque within the normal caliber
aorta. Reactive adenopathy in the right lower quadrant. Otherwise,
no concerning abdominopelvic lymph nodes.

Reproductive: The prostate and seminal vesicles are unremarkable.

Other: Stranding and phlegmonous change and small amount of likely
reactive free fluid in the right lower quadrant layering within the
deep pelvis.

Musculoskeletal: Minimal degenerative changes in the spine. Moderate
degenerative features noted in the bilateral hips with
periacetabular spurring. No acute or worrisome osseous lesions.
IMPRESSION: 1. Acute appendicitis with extensive surrounding phlegmonous change
and small amount of likely reactive free fluid in the right lower
quadrant. Slight reactive thickening of the adjacent cecum and
terminal ileum. No convincing features of perforation such as
extraluminal gas, organized collection or abscess is seen.
2. Hepatic steatosis.
3. Coronary artery calcifications.
4. Aortic Atherosclerosis ([id number]-R2C.C).
